# Patient Record
Sex: Male | Born: 1980
Health system: Southern US, Community
[De-identification: ages and names within clinical notes are randomized; demographics above are authoritative.]

## PROBLEM LIST (undated history)

## (undated) DIAGNOSIS — I10 Essential (primary) hypertension: Secondary | ICD-10-CM

## (undated) DIAGNOSIS — E119 Type 2 diabetes mellitus without complications: Secondary | ICD-10-CM

## (undated) HISTORY — PX: WRIST SURGERY: SHX841

---

## 2006-05-29 ENCOUNTER — Emergency Department (HOSPITAL_COMMUNITY): Admission: EM | Admit: 2006-05-29 | Discharge: 2006-05-29 | Payer: Self-pay | Admitting: Family Medicine

## 2007-01-09 ENCOUNTER — Emergency Department (HOSPITAL_COMMUNITY): Admission: EM | Admit: 2007-01-09 | Discharge: 2007-01-09 | Payer: Self-pay | Admitting: Emergency Medicine

## 2007-01-11 ENCOUNTER — Emergency Department (HOSPITAL_COMMUNITY): Admission: EM | Admit: 2007-01-11 | Discharge: 2007-01-11 | Payer: Self-pay | Admitting: Family Medicine

## 2007-01-15 ENCOUNTER — Emergency Department (HOSPITAL_COMMUNITY): Admission: EM | Admit: 2007-01-15 | Discharge: 2007-01-15 | Payer: Self-pay | Admitting: Family Medicine

## 2007-01-18 ENCOUNTER — Emergency Department (HOSPITAL_COMMUNITY): Admission: EM | Admit: 2007-01-18 | Discharge: 2007-01-18 | Payer: Self-pay | Admitting: Family Medicine

## 2007-01-23 ENCOUNTER — Emergency Department (HOSPITAL_COMMUNITY): Admission: EM | Admit: 2007-01-23 | Discharge: 2007-01-23 | Payer: Self-pay | Admitting: Family Medicine

## 2007-03-10 ENCOUNTER — Inpatient Hospital Stay (HOSPITAL_COMMUNITY): Admission: EM | Admit: 2007-03-10 | Discharge: 2007-03-12 | Payer: Self-pay | Admitting: Emergency Medicine

## 2008-03-13 ENCOUNTER — Emergency Department (HOSPITAL_COMMUNITY): Admission: EM | Admit: 2008-03-13 | Discharge: 2008-03-13 | Payer: Self-pay | Admitting: Family Medicine
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
## 2011-03-07 ENCOUNTER — Inpatient Hospital Stay (INDEPENDENT_AMBULATORY_CARE_PROVIDER_SITE_OTHER)
Admission: RE | Admit: 2011-03-07 | Discharge: 2011-03-07 | Disposition: A | Discharge status: Home / Self Care | Payer: Self-pay | Source: Ambulatory Visit | Attending: Family Medicine | Admitting: Family Medicine

## 2011-03-07 DIAGNOSIS — J020 Streptococcal pharyngitis: Secondary | ICD-10-CM

## 2011-05-16 LAB — HEPATITIS C ANTIBODY: HCV Ab: NEGATIVE

## 2011-10-29 ENCOUNTER — Emergency Department (HOSPITAL_COMMUNITY)
Admission: EM | Admit: 2011-10-29 | Discharge: 2011-10-29 | Disposition: A | Discharge status: Home / Self Care | Payer: Self-pay | Source: Home / Self Care | Attending: Family Medicine | Admitting: Family Medicine

## 2011-10-29 ENCOUNTER — Encounter (HOSPITAL_COMMUNITY): Payer: Self-pay

## 2011-10-29 DIAGNOSIS — R0789 Other chest pain: Secondary | ICD-10-CM

## 2011-10-29 MED ORDER — IBUPROFEN 800 MG PO TABS: 800.0000 mg | ORAL_TABLET | Freq: Three times a day (TID) | ORAL | Status: AC

## 2011-10-29 NOTE — ED Provider Notes (Addendum)
History     CSN: [id number]  Arrival date & time 10/29/11  1225   First MD Initiated Contact with Patient 10/29/11 1234      Chief Complaint  Patient presents with  . Chest Pain    (Consider location/radiation/quality/duration/timing/severity/associated sxs/prior treatment) Patient is a 31 y.o. male presenting with chest pain. The history is provided by the patient.  Chest Pain The chest pain began 1 - 2 hours ago. Chest pain occurs intermittently. The chest pain is improving. Associated with: turned in lobby and felt sharp pain in left lat chest. The severity of the pain is mild. The quality of the pain is described as sharp. The pain does not radiate. Chest pain is worsened by certain positions. Pertinent negatives for primary symptoms include no palpitations.     History reviewed. No pertinent past medical history.  History reviewed. No pertinent past surgical history.  History reviewed. No pertinent family history.  History  Substance Use Topics  . Smoking status: Never Smoker   . Smokeless tobacco: Not on file  . Alcohol Use: No      Review of Systems  Constitutional: Negative.   Respiratory: Negative.   Cardiovascular: Positive for chest pain. Negative for palpitations.  Gastrointestinal: Negative.     Allergies  Review of patient's allergies indicates no known allergies.  Home Medications   No current outpatient prescriptions on file.  BP 157/83  Pulse 72  Temp(Src) 98.2 F (36.8 C) (Oral)  Resp 18  SpO2 100%  Physical Exam  Nursing note and vitals reviewed. Constitutional: He is oriented to person, place, and time. He appears well-developed and well-nourished.  HENT:  Head: Normocephalic.  Right Ear: External ear normal.  Left Ear: External ear normal.  Mouth/Throat: Oropharynx is clear and moist.  Eyes: Conjunctivae are normal. Pupils are equal, round, and reactive to light.  Neck: Normal range of motion. Neck supple.  Cardiovascular: Normal  rate, regular rhythm, normal heart sounds and intact distal pulses.   Pulmonary/Chest: Breath sounds normal. He exhibits tenderness.  Abdominal: Bowel sounds are normal.  Lymphadenopathy:    He has no cervical adenopathy.  Neurological: He is alert and oriented to person, place, and time.  Skin: Skin is warm and dry.    ED Course  Procedures (including critical care time)  Labs Reviewed - No data to display No results found.   1. Musculoskeletal chest pain       MDM          Linna Hoff, MD 10/29/11 1403  Linna Hoff, MD 11/26/11 [phone]

## 2011-10-29 NOTE — ED Notes (Signed)
Pt states he has chest wall pain that started two hours ago, pain when turning body or moving.  Denies sob

## 2011-10-29 NOTE — Discharge Instructions (Signed)
Use ibuprofen as needed, activity as tolerated,

## 2012-03-06 ENCOUNTER — Encounter (HOSPITAL_BASED_OUTPATIENT_CLINIC_OR_DEPARTMENT_OTHER): Payer: Self-pay | Admitting: *Deleted

## 2012-03-06 ENCOUNTER — Emergency Department (HOSPITAL_BASED_OUTPATIENT_CLINIC_OR_DEPARTMENT_OTHER): Payer: BC Managed Care – PPO

## 2012-03-06 ENCOUNTER — Emergency Department (HOSPITAL_BASED_OUTPATIENT_CLINIC_OR_DEPARTMENT_OTHER)
Admission: EM | Admit: 2012-03-06 | Discharge: 2012-03-07 | Disposition: A | Discharge status: Home / Self Care | Payer: BC Managed Care – PPO | Attending: Emergency Medicine | Admitting: Emergency Medicine

## 2012-03-06 DIAGNOSIS — R739 Hyperglycemia, unspecified: Secondary | ICD-10-CM

## 2012-03-06 DIAGNOSIS — E119 Type 2 diabetes mellitus without complications: Secondary | ICD-10-CM

## 2012-03-06 LAB — COMPREHENSIVE METABOLIC PANEL
ALT: 38 U/L (ref 0–53)
AST: 21 U/L (ref 0–37)
Albumin: 4.1 g/dL (ref 3.5–5.2)
Alkaline Phosphatase: 114 U/L (ref 39–117)
BUN: 17 mg/dL (ref 6–23)
Chloride: 85 mEq/L — ABNORMAL LOW (ref 96–112)
Potassium: 4.2 mEq/L (ref 3.5–5.1)
Sodium: 126 mEq/L — ABNORMAL LOW (ref 135–145)
Total Bilirubin: 0.5 mg/dL (ref 0.3–1.2)
Total Protein: 8 g/dL (ref 6.0–8.3)

## 2012-03-06 LAB — URINALYSIS, ROUTINE W REFLEX MICROSCOPIC
Bilirubin Urine: NEGATIVE
Glucose, UA: >1000 mg/dL — AB
Hgb urine dipstick: NEGATIVE
Ketones, ur: NEGATIVE mg/dL
Leukocytes, UA: NEGATIVE
Protein, ur: NEGATIVE mg/dL
pH: 5.5 (ref 5.0–8.0)

## 2012-03-06 LAB — CBC WITH DIFFERENTIAL/PLATELET
Basophils Absolute: 0 10*3/uL (ref 0.0–0.1)
Basophils Relative: 1 % (ref 0–1)
Eosinophils Absolute: 0.1 10*3/uL (ref 0.0–0.7)
Hemoglobin: 16.1 g/dL (ref 13.0–17.0)
MCH: 28.9 pg (ref 26.0–34.0)
MCHC: 36.8 g/dL — ABNORMAL HIGH (ref 30.0–36.0)
Monocytes Relative: 10 % (ref 3–12)
Neutro Abs: 3.1 10*3/uL (ref 1.7–7.7)
Neutrophils Relative %: 59 % (ref 43–77)
Platelets: 189 10*3/uL (ref 150–400)
RBC: 5.57 MIL/uL (ref 4.22–5.81)

## 2012-03-06 LAB — POCT I-STAT 3, VENOUS BLOOD GAS (G3P V)
O2 Saturation: 67 %
TCO2: 31 mmol/L (ref 0–100)
pCO2, Ven: 54.4 mmHg — ABNORMAL HIGH (ref 45.0–50.0)
pH, Ven: 7.341 — ABNORMAL HIGH (ref 7.250–7.300)
pO2, Ven: 38 mmHg (ref 30.0–45.0)

## 2012-03-06 LAB — GLUCOSE, CAPILLARY
Glucose-Capillary: >600 mg/dL (ref 70–99)
Glucose-Capillary: >600 mg/dL (ref 70–99)
Glucose-Capillary: 528 mg/dL — ABNORMAL HIGH (ref 70–99)
Glucose-Capillary: 380 mg/dL — ABNORMAL HIGH (ref 70–99)

## 2012-03-06 LAB — URINE MICROSCOPIC-ADD ON

## 2012-03-06 MED ORDER — SODIUM CHLORIDE 0.9 % IV BOLUS (SEPSIS)
1000.0000 mL | Freq: Once | INTRAVENOUS | Status: AC
  Administered 2012-03-06: 1000 mL via INTRAVENOUS

## 2012-03-06 MED ORDER — INSULIN REGULAR HUMAN 100 UNIT/ML IJ SOLN
INTRAMUSCULAR | Status: AC
  Administered 2012-03-06: 5 [IU] via SUBCUTANEOUS
  Filled 2012-03-06: qty 1

## 2012-03-06 MED ORDER — INSULIN REGULAR HUMAN 100 UNIT/ML IJ SOLN
INTRAMUSCULAR | Status: AC
  Filled 2012-03-06: qty 1

## 2012-03-06 MED ORDER — INSULIN REGULAR HUMAN 100 UNIT/ML IJ SOLN
10.0000 [IU] | Freq: Once | INTRAMUSCULAR | Status: AC
  Administered 2012-03-06: 10 [IU] via SUBCUTANEOUS

## 2012-03-06 MED ORDER — INSULIN REGULAR HUMAN 100 UNIT/ML IJ SOLN
5.0000 [IU] | Freq: Once | INTRAMUSCULAR | Status: AC
  Administered 2012-03-06: 5 [IU] via SUBCUTANEOUS

## 2012-03-06 MED ORDER — INSULIN REGULAR HUMAN 100 UNIT/ML IJ SOLN: 10.0000 [IU] | Freq: Once | INTRAMUSCULAR | Status: DC

## 2012-03-06 MED ORDER — INSULIN REGULAR HUMAN 100 UNIT/ML IJ SOLN: 5.0000 [IU] | Freq: Once | INTRAMUSCULAR | Status: DC

## 2012-03-06 MED ORDER — ONDANSETRON HCL 4 MG/2ML IJ SOLN
4.0000 mg | Freq: Once | INTRAMUSCULAR | Status: AC
  Administered 2012-03-06: 4 mg via INTRAVENOUS
  Filled 2012-03-06: qty 2

## 2012-03-06 NOTE — ED Notes (Signed)
Message left with Social worker on call Lennox Laity,  Needs for lancets, glucometer and strips for newly dx diabetic. Pts info given to Child psychotherapist

## 2012-03-06 NOTE — ED Provider Notes (Signed)
History     CSN: [id number]  Arrival date & time 03/06/12  Tim Tate   First MD Initiated Contact with Patient 03/06/12 1933      Chief Complaint  Patient presents with  . Sore Throat  . Emesis  . Hyperglycemia    (Consider location/radiation/quality/duration/timing/severity/associated sxs/prior treatment) HPI Patient is a 31 year old male with no prior history of medical problems who presents today complaining of nausea, vomiting, diarrhea, increased thirst, increased urination over the past week. Patient is tachycardic to 110 upon arrival. Patient denies any abdominal pain. He endorses some sore throat which she attributes to vomiting. Patient denies any known sick contacts. He's not had any blood in his stools. Patient also reports some body aches as well. Patient denies any cough, chest pain, shortness of breath. There no other associated or modifying factors. History reviewed. No pertinent past medical history.  History reviewed. No pertinent past surgical history.  History reviewed. No pertinent family history.  History  Substance Use Topics  . Smoking status: Never Smoker   . Smokeless tobacco: Not on file  . Alcohol Use: No      Review of Systems  Constitutional: Negative.   HENT: Negative.   Eyes: Negative.   Respiratory: Negative.   Cardiovascular: Negative.   Gastrointestinal: Positive for nausea, vomiting and diarrhea.  Genitourinary: Positive for frequency.  Musculoskeletal: Negative.   Skin: Negative.   Neurological: Negative.   Hematological: Negative.   Psychiatric/Behavioral: Negative.   All other systems reviewed and are negative.    Allergies  Review of patient's allergies indicates no known allergies.  Home Medications  No current outpatient prescriptions on file.  BP 156/85  Pulse 80  Temp 98.3 F (36.8 C) (Oral)  Resp 18  Ht 5\' 10"  (1.778 m)  Wt 240 lb (108.863 kg)  BMI 34.44 kg/m2  SpO2 97%  Physical Exam  Nursing note and vitals  reviewed. GEN: Well-developed, well-nourished male in no distress HEENT: Atraumatic, normocephalic. Oropharynx clear without erythema EYES: PERRLA BL, no scleral icterus. NECK: Trachea midline, no meningismus CV: Tachycardic with regular rhythm. No murmurs, rubs, or gallops PULM: No respiratory distress.  No crackles, wheezes, or rales. GI: soft, non-tender. No guarding, rebound, or tenderness. + bowel sounds  GU: deferred Neuro: cranial nerves grossly 2-12 intact, no abnormalities of strength or sensation, A and O x 3 MSK: Patient moves all 4 extremities symmetrically, no deformity, edema, or injury noted Skin: No rashes petechiae, purpura, or jaundice Psych: no abnormality of mood   ED Course  Procedures (including critical care time)  Labs Reviewed  GLUCOSE, CAPILLARY - Abnormal; Notable for the following:    Glucose-Capillary >600 (*)     All other components within normal limits  CBC WITH DIFFERENTIAL - Abnormal; Notable for the following:    MCHC 36.8 (*)     All other components within normal limits  COMPREHENSIVE METABOLIC PANEL - Abnormal; Notable for the following:    Sodium 126 (*)     Chloride 85 (*)     Glucose, Bld 741 (*)     GFR calc non Af Amer 71 (*)     GFR calc Af Amer 83 (*)     All other components within normal limits  URINALYSIS, ROUTINE W REFLEX MICROSCOPIC - Abnormal; Notable for the following:    Specific Gravity, Urine 1.039 (*)     Glucose, UA >1000 (*)     All other components within normal limits  POCT I-STAT 3, BLOOD GAS (G3P V) -  Abnormal; Notable for the following:    pH, Ven 7.341 (*)     pCO2, Ven 54.4 (*)     Bicarbonate 29.4 (*)     All other components within normal limits  GLUCOSE, CAPILLARY - Abnormal; Notable for the following:    Glucose-Capillary >600 (*)     All other components within normal limits  GLUCOSE, CAPILLARY - Abnormal; Notable for the following:    Glucose-Capillary 528 (*)     All other components within normal  limits  GLUCOSE, CAPILLARY - Abnormal; Notable for the following:    Glucose-Capillary 380 (*)     All other components within normal limits  LIPASE, BLOOD  KETONES, QUALITATIVE  URINE MICROSCOPIC-ADD ON  BLOOD GAS, VENOUS   Dg Chest 2 View  03/06/2012  *RADIOLOGY REPORT*  Clinical Data: Nausea, vomiting, sore throat  CHEST - 2 VIEW  Comparison:  None.  Findings:  The heart size and mediastinal contours are within normal limits.  Both lungs are clear.  The visualized skeletal structures are unremarkable.  IMPRESSION: No active cardiopulmonary disease.  Original Report Authenticated By: Judie Petit. Ruel Favors, M.D.     1. Hyperglycemia without ketosis   2. Diabetes mellitus, new onset       MDM  Patient was evaluated by myself. The patient reported no history of diabetes but had glucose greater than 600. Based on presentation with polyuria and polydipsia as well as nausea, vomiting, diarrhea I was concerned about new onset diabetes. Urinalysis showed greater than 1000 glucose but patient had no ketones noted. Patient had VBG which showed no significant acidosis. Serum ketones were also checked and were negative. Patient had a hyponatremia which was a pseudohyponatremia secondary to hyperglycemia. He had no abnormalities of liver function and lipase. Patient's urinalysis showed no signs of infection. Chest x-ray was performed and was negative. Patient was treated with IV fluids and insulin. Patient does not currently have a primary care provider but preferred discharge home. Glucose was improving. Patient was feeling better. He had no vomiting or diarrhea here. I discussed the patient with hospitalist, Dr. Toniann Fail. Given the patient's degree of hyperglycemia on presentation he recommended starting the patient on Amaryl 1 mg in the morning and metformin 500 mg at night. Patient was referred to Child psychotherapist at [facility] for resources including glucometer, lancets, and testing trips. Patient also was  given prescriptions. He was given contact number for her nurse manager here to assist him with this if he has any difficulties tomorrow.        Cyndra Numbers, MD 03/07/12 [phone]

## 2012-03-06 NOTE — ED Notes (Signed)
Pt c/o n/v sore throat and bodyaches x 1 week

## 2012-03-06 NOTE — ED Notes (Signed)
Dr. Hunt at bedside.

## 2012-03-06 NOTE — ED Notes (Signed)
Pt reports nausea and vomiting x 1 week with increased thirst. Denies abdominal pains. States he thinks his throat hurts from all of his vomiting. Denies any hx of diabetes.

## 2012-03-06 NOTE — ED Notes (Signed)
Pt was complaining of nausea. States he thinks it may be from the 10 large cups of water he has consumed since arrival. Pt given saltine crackers and encouraged to eat. zofran administered. Will reassess shortly.

## 2012-03-07 LAB — GLUCOSE, CAPILLARY: Glucose-Capillary: 365 mg/dL — ABNORMAL HIGH (ref 70–99)

## 2012-03-07 MED ORDER — METFORMIN HCL 500 MG PO TABS: 500.0000 mg | ORAL_TABLET | Freq: Every day | ORAL | Status: DC

## 2012-03-07 MED ORDER — GLUCOSE BLOOD VI STRP: ORAL_STRIP | Status: AC

## 2012-03-07 MED ORDER — GLIMEPIRIDE 1 MG PO TABS: 1.0000 mg | ORAL_TABLET | Freq: Every day | ORAL | Status: DC

## 2012-03-07 MED ORDER — FREESTYLE LANCETS MISC: Status: AC

## 2012-03-07 NOTE — ED Notes (Signed)
Spoke to patient in depth regarding diabetes and f/u care, diet, exercise and glucose testing. Pt was given the [facility] Case Manager's phone number to f/u with for resource assistance for glucometer, lancets and test strips. Pt was given the number of on-call case manager Lennox Laity [phone]. Pt was told to call her first for assistance, and if unable to reach her, then to call back to [facility] to speak with day shift charge RN, Larene Beach, and ask for assistance. Pt verbalized understanding. States his mother is a Engineer, civil (consulting) and will be able to recommend a PMD to f/u with, and his girlfriend has had experience in the past checking glucoses with the glucometer machine. Pt understands changes he needs to make regarding his diet, lifestyle and exercise and he expressed interest and desire in changing his poor lifestyle habits.

## 2012-03-07 NOTE — ED Notes (Signed)
Randa Evens, pharm tech called to find out options for assistance with testing supplies and the only option available is for pt to call "[facility]" [facility] at [phone]. Pt was called by this nurse and given information. Pt sts he was able to get meds filled and is feeling "much better".

## 2012-03-07 NOTE — Progress Notes (Signed)
Received consult from [facility] [place] with regard to new diabetes diagnosis for patient. I contacted patient and educated him about [facility] for free meds, the [employer] glucometer program at [place], and reviewed the list of self-pay facilities in the area. Patient was very appreciative for the information and services we provided.

## 2013-04-08 ENCOUNTER — Encounter (HOSPITAL_BASED_OUTPATIENT_CLINIC_OR_DEPARTMENT_OTHER): Payer: Self-pay

## 2013-04-08 ENCOUNTER — Emergency Department (HOSPITAL_BASED_OUTPATIENT_CLINIC_OR_DEPARTMENT_OTHER)
Admission: EM | Admit: 2013-04-08 | Discharge: 2013-04-08 | Disposition: A | Discharge status: Home / Self Care | Payer: BC Managed Care – PPO | Attending: Emergency Medicine | Admitting: Emergency Medicine

## 2013-04-08 DIAGNOSIS — H109 Unspecified conjunctivitis: Secondary | ICD-10-CM | POA: Insufficient documentation

## 2013-04-08 DIAGNOSIS — E119 Type 2 diabetes mellitus without complications: Secondary | ICD-10-CM | POA: Insufficient documentation

## 2013-04-08 HISTORY — DX: Type 2 diabetes mellitus without complications: E11.9

## 2013-04-08 MED ORDER — ERYTHROMYCIN 5 MG/GM OP OINT
1.0000 "application " | TOPICAL_OINTMENT | Freq: Four times a day (QID) | OPHTHALMIC | Status: DC
  Administered 2013-04-08: 1 via OPHTHALMIC
  Filled 2013-04-08: qty 3.5

## 2013-04-08 NOTE — ED Provider Notes (Signed)
CSN: [id number]     Arrival date & time 04/08/13  1804 History  This chart was scribed for Tim Munch, MD by Arlan Organ, ED Scribe. This patient was seen in room MH07/MH07 and the patient's care was started at 6:38 PM.   Chief Complaint  Patient presents with  . Eye Problem   (Consider location/radiation/quality/duration/timing/severity/associated sxs/prior Treatment) The history is provided by the patient. No language interpreter was used.   HPI Comments: Tim Tate is a 32 y.o. male diabetic who presents to the Emergency Department complaining of  bilateral eye redness and irritation with associated clear discharge that first appeared 2 days ago. Pt states when it first occurred, his eye was painful to the touch, but has some what improved over time.  Pt has tried Visine with no relief. Pt denies sore throat, nausea, emesis, fever, and HA. Pt states he just got back from a cruise to the [place] today. Pt states his diabetes is currently well managed with prescribed pills, his sugars are normally in the 90's.   Past Medical History  Diagnosis Date  . Diabetes mellitus without complication    Past Surgical History  Procedure Laterality Date  . Wrist surgery     No family history on file. History  Substance Use Topics  . Smoking status: Never Smoker   . Smokeless tobacco: Not on file  . Alcohol Use: No    Review of Systems  Constitutional:       Per HPI, otherwise negative  HENT:       Per HPI, otherwise negative  Respiratory:       Per HPI, otherwise negative  Cardiovascular:       Per HPI, otherwise negative  Gastrointestinal: Negative for vomiting.  Endocrine:       Negative aside from HPI  Genitourinary:       Neg aside from HPI   Musculoskeletal:       Per HPI, otherwise negative  Skin: Negative.   Neurological: Negative for syncope.    Allergies  Review of patient's allergies indicates no known allergies.  Home Medications   Current Outpatient  Rx  Name  Route  Sig  Dispense  Refill  . EXPIRED: glimepiride (AMARYL) 1 MG tablet   Oral   Take 1 tablet (1 mg total) by mouth daily before breakfast.   30 tablet   1   . EXPIRED: metFORMIN (GLUCOPHAGE) 500 MG tablet   Oral   Take 1 tablet (500 mg total) by mouth at bedtime.   30 tablet   1    BP 147/92  Pulse 94  Temp(Src) 98.4 F (36.9 C) (Oral)  Resp 16  Ht 5\' 9"  (1.753 m)  Wt 245 lb (111.131 kg)  BMI 36.16 kg/m2  SpO2 98% Physical Exam  Nursing note and vitals reviewed. Constitutional: He is oriented to person, place, and time. He appears well-developed. No distress.  HENT:  Head: Normocephalic and atraumatic.  Eyes: EOM are normal. Pupils are equal, round, and reactive to light.  Bilateral conjunctival injection Mild edema in bilateral eyelids  Cardiovascular: Normal rate and regular rhythm.   Pulmonary/Chest: Effort normal. No stridor. No respiratory distress.  Abdominal: He exhibits no distension.  Musculoskeletal: He exhibits no edema.  Neurological: He is alert and oriented to person, place, and time.  Skin: Skin is warm and dry.  Psychiatric: He has a normal mood and affect.    ED Course  Procedures (including critical care time)  DIAGNOSTIC STUDIES: Oxygen Saturation  is 98% on RA, Normal by my interpretation.    COORDINATION OF CARE: 6:50 PM-Discussed treatment plan which includes antibiotics. Advised pt to follow up with ophthalmologist if eye problem does not improve in 3 days. Pt agreed to plan.    Labs Review Labs Reviewed - No data to display Imaging Review No results found.  MDM  No diagnosis found.  I personally performed the services described in this documentation, which was scribed in my presence. The recorded information has been reviewed and is accurate.   Patient presents with several days of bilateral injected eyes with discharge.  Patient is in no acute with no acuity loss, no fever, no other signs or symptoms suggestive of  systemic infection.  Absent distress, absent pain and there is also little suspicion for corneal abrasion.  Patient started on erythromycin topical medication for conjunctivitis, discharged to f/u w optho  Tim Munch, MD 04/08/13 [phone]

## 2013-04-08 NOTE — ED Notes (Signed)
bilat eye redness x 3 days

## 2013-04-08 NOTE — Discharge Instructions (Signed)
As discussed, it is important to follow up with our ophthalmologist if her condition does not improve in the next 3 or 4 days.  Please return here for concerning changes in your condition   Conjunctivitis Conjunctivitis is commonly called "pink eye." Conjunctivitis can be caused by bacterial or viral infection, allergies, or injuries. There is usually redness of the lining of the eye, itching, discomfort, and sometimes discharge. There may be deposits of matter along the eyelids. A viral infection usually causes a watery discharge, while a bacterial infection causes a yellowish, thick discharge. Pink eye is very contagious and spreads by direct contact. You may be given antibiotic eyedrops as part of your treatment. Before using your eye medicine, remove all drainage from the eye by washing gently with warm water and cotton balls. Continue to use the medication until you have awakened 2 mornings in a row without discharge from the eye. Do not rub your eye. This increases the irritation and helps spread infection. Use separate towels from other household members. Wash your hands with soap and water before and after touching your eyes. Use cold compresses to reduce pain and sunglasses to relieve irritation from light. Do not wear contact lenses or wear eye makeup until the infection is gone. SEEK MEDICAL CARE IF:   Your symptoms are not better after 3 days of treatment.  You have increased pain or trouble seeing.  The outer eyelids become very red or swollen. Document Released: 08/25/2004 Document Revised: 10/10/2011 Document Reviewed: 07/18/2005 [employer] Patient Information 2014 [place], [place].

## 2013-04-08 NOTE — ED Notes (Signed)
MD at bedside. 

## 2013-04-10 NOTE — ED Notes (Signed)
Pt called requesting a note for school and work that states he was to be held out. Pt informed that since he did not receive a note while here he would need to call PCP or return for eval. Pt told he could receive a note that stated he was seen here on date of visit.

## 2014-01-05 IMAGING — CR DG CHEST 2V
2 series · 2 of 2 positions shown · non-contrast
Comparison: None.

CLINICAL DATA: Nausea, vomiting, sore throat

CHEST - 2 VIEW

[w chest pa]
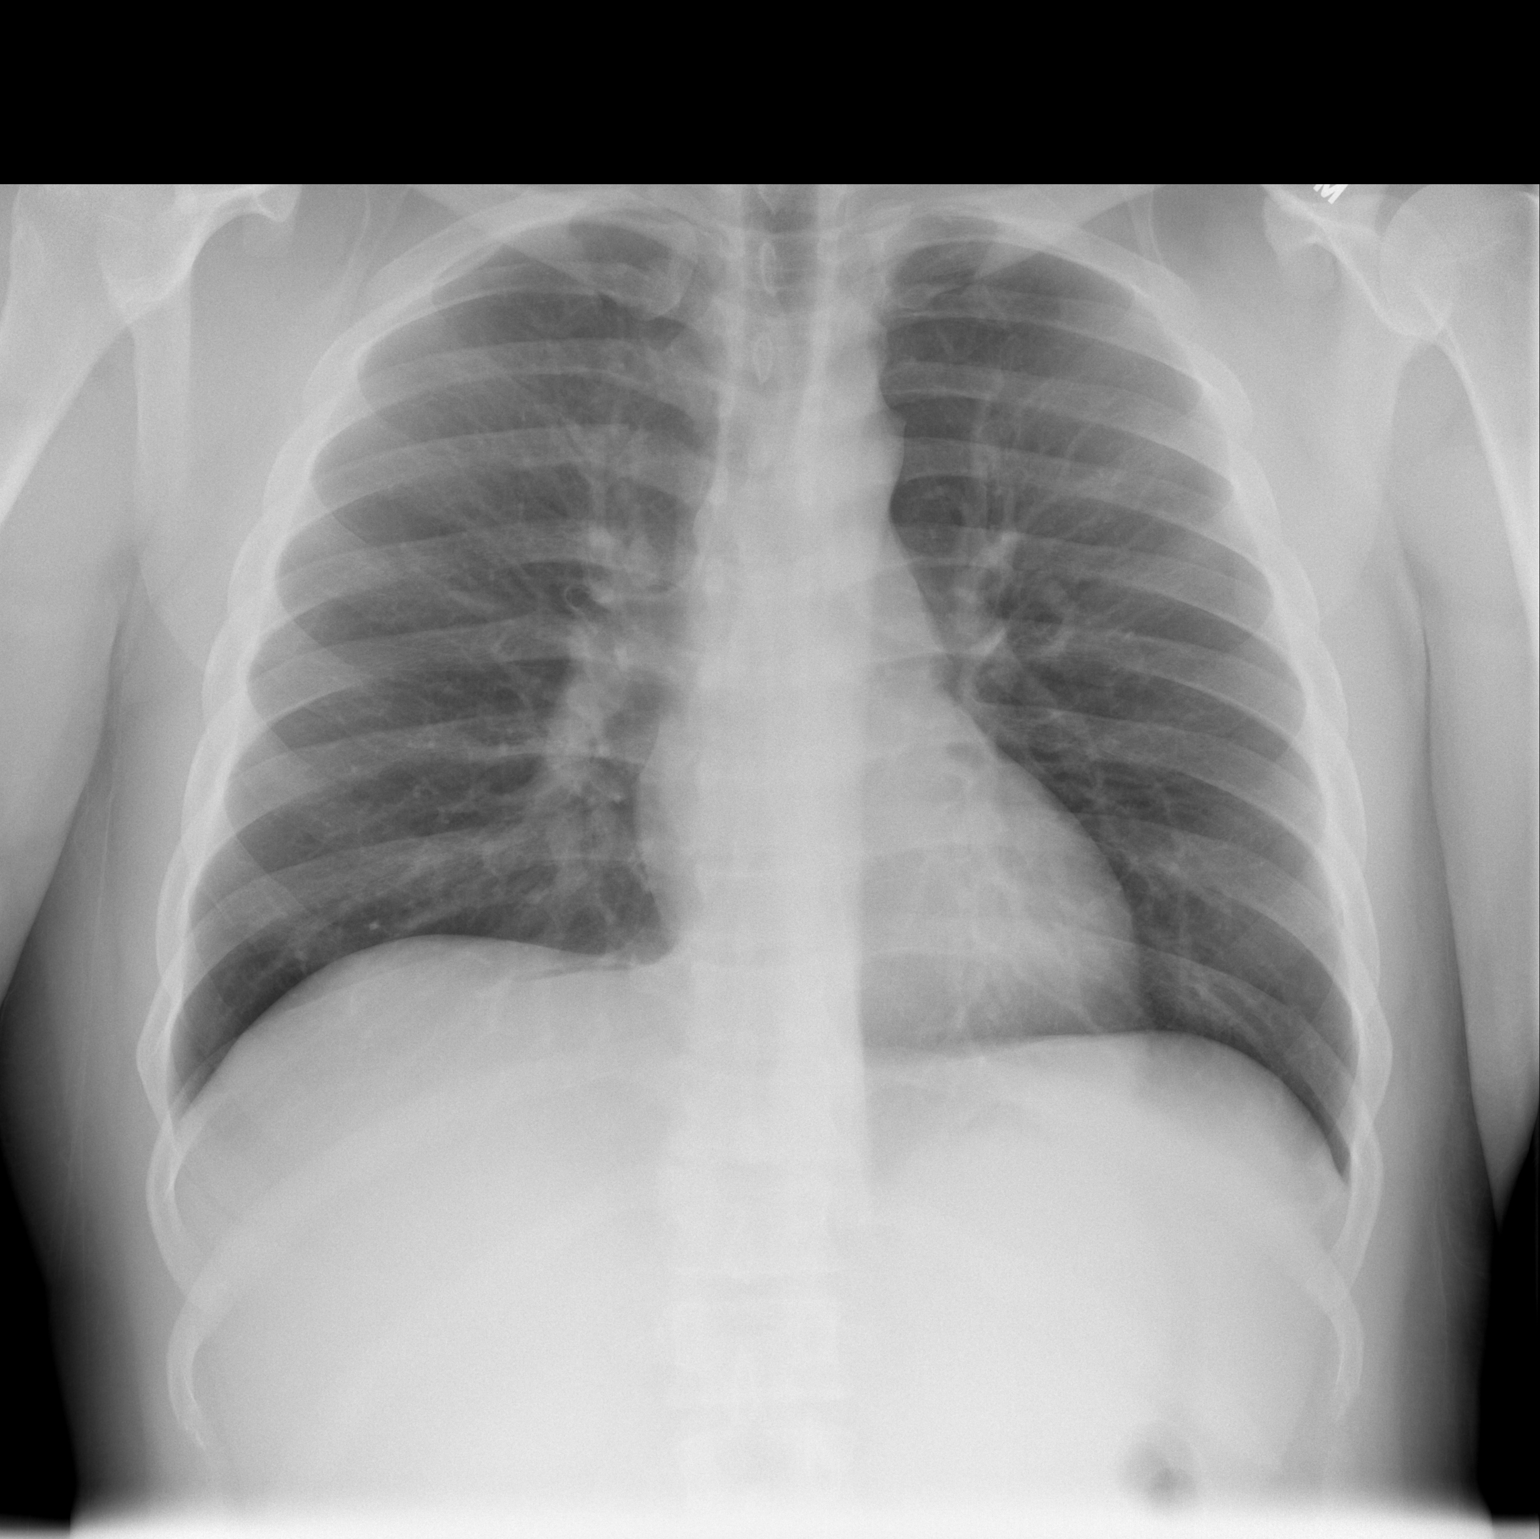

[w chest lat]
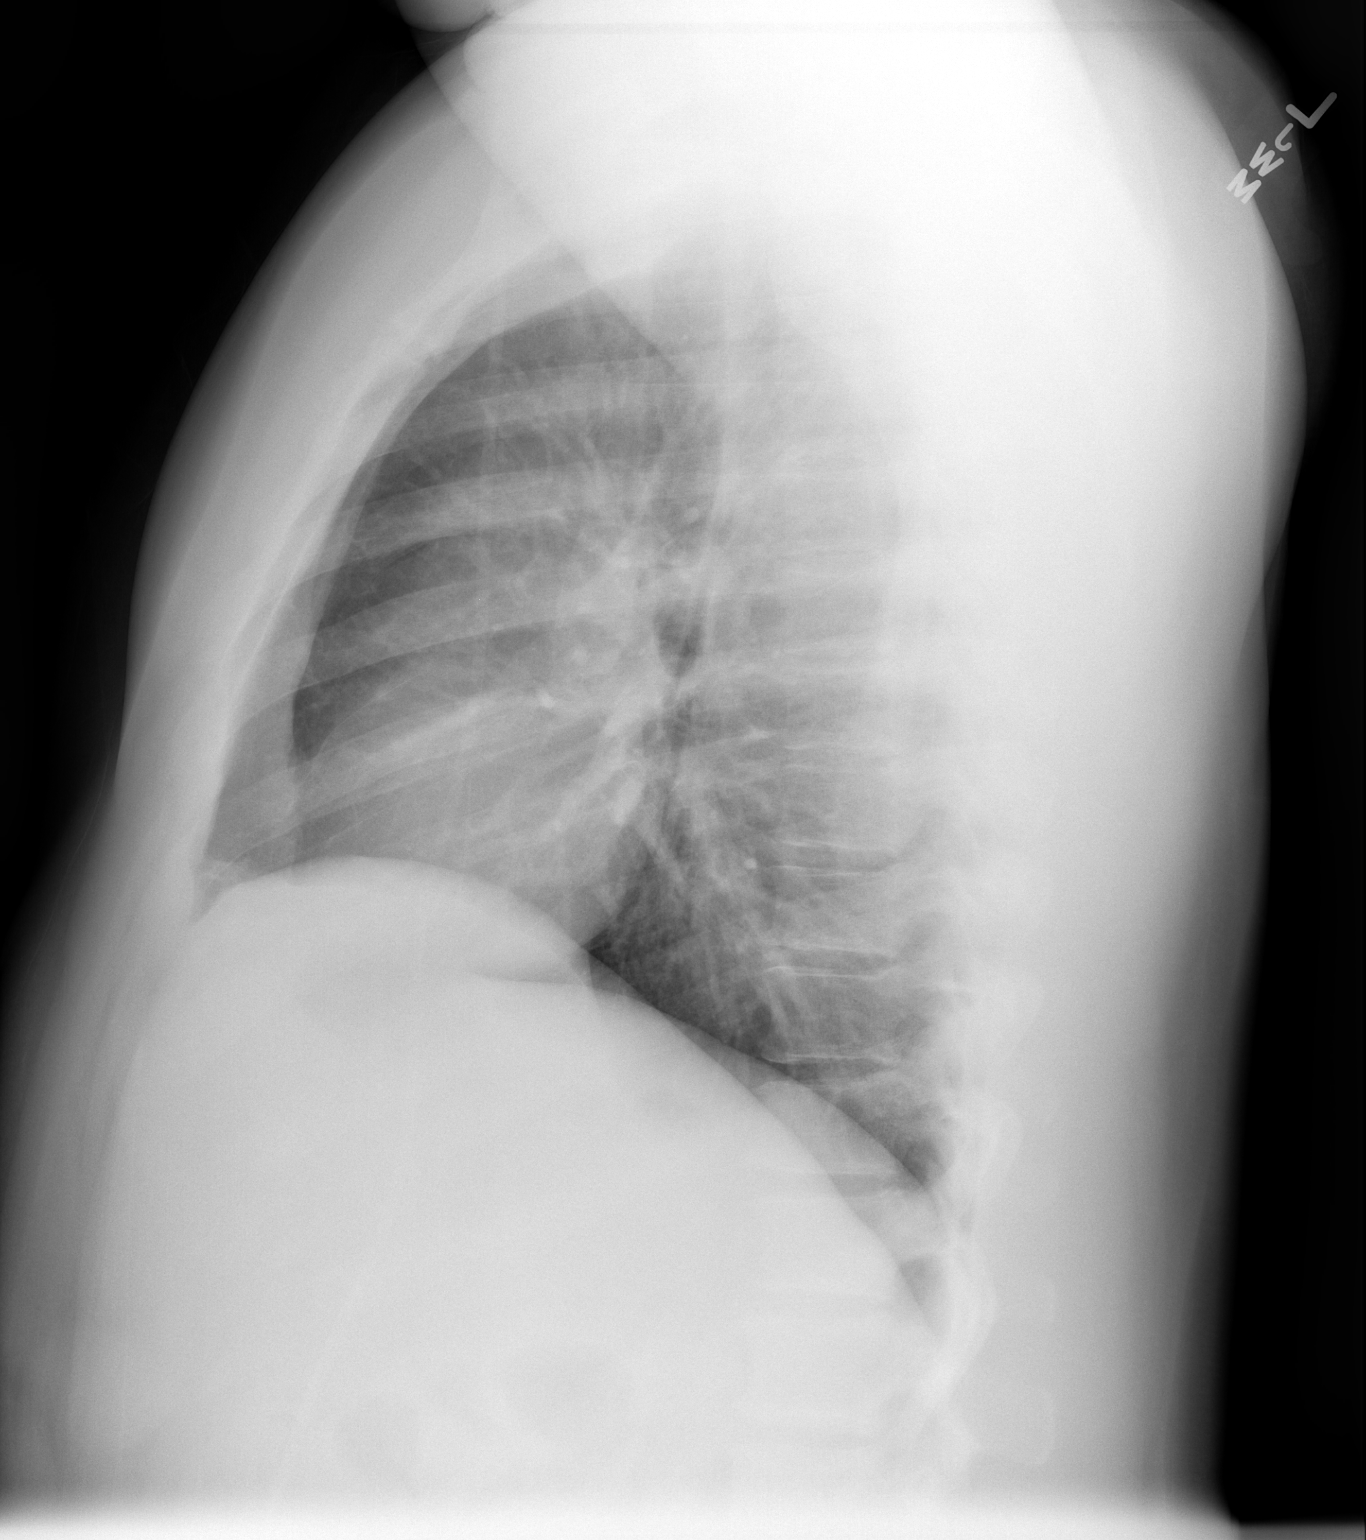

[2 of 2 positions shown; findings below may reference images not displayed]

FINDINGS: The heart size and mediastinal contours are within
normal limits.  Both lungs are clear.  The visualized skeletal
structures are unremarkable.
IMPRESSION: No active cardiopulmonary disease.

## 2016-06-29 ENCOUNTER — Encounter: Payer: Self-pay | Admitting: Podiatry

## 2016-06-29 ENCOUNTER — Ambulatory Visit (INDEPENDENT_AMBULATORY_CARE_PROVIDER_SITE_OTHER): Payer: 59 | Admitting: Podiatry

## 2016-06-29 VITALS — BP 134/87 | HR 89 | Resp 18

## 2016-06-29 DIAGNOSIS — B351 Tinea unguium: Secondary | ICD-10-CM | POA: Diagnosis not present

## 2016-06-29 DIAGNOSIS — E119 Type 2 diabetes mellitus without complications: Secondary | ICD-10-CM | POA: Diagnosis not present

## 2016-06-29 NOTE — Progress Notes (Signed)
   Subjective:    Patient ID: Tim Tate, male    DOB: 02/19/1981, 35 y.o.   MRN: [id number]  HPI    This patient presents today at the recommendation of his primary care physician for a diabetic foot examination. Patient is diabetic for approximately 4 years and denies any history of foot ulceration, claudication or amputation. Patient denies history of smoking. Patient has noticed some thickening in the toenails gradually over the last several years without any specific treatment. He also notices that his feet are dry and scaling and denies any specific treatment for these problems.  Patient is a Secretary/administrator  Review of Systems  All other systems reviewed and are negative.      Objective:   Physical Exam  Orientated 3  Vascular: No peripheral edema bilaterally No calf tenderness bilaterally DP and PT pulses 2/4 bilaterally Capillary reflex immediate bilaterally  Neurological: Sensation to 10 g monofilament wire intact 5/5 bilaterally Vibratory sensation reactive bilaterally Ankle reflex equal and reactive bilaterally  Dermatological: No open skin lesions bilaterally Mild plantar scaling bilaterally Deformed discolored toenails 1, 4, 5 bilaterally  Musculoskeletal: HAV bilaterally There is no pain or crepitus on range of motion ankle, subtalar, midtarsal joints bilaterally Manual motor testing dorsi flexion, plantar flexion, inversion, eversion 5/5 bilaterally      Assessment & Plan:   Assessment: Diabetic with satisfactory neurovascular status Diabetic without foot complications Mycotic toenails 6 Dry skin versus possible tinea pedis bilaterally  Plan: Today I reviewed the results of the exam in detail. We had a discussion about treatment of suspect fungal toenails including no treatment, debridement, topical medication oral medication. I informed patient that the most reliable treatment for treatment of fungal toenails a proximal medial percent  successful was oral medication. Patient is willing to use oral medication if lab is positive for fungal infection  Debridement of left hallux nail obtaining nail fragments and subungual debris and fourth right toenail with similar appearance and submitted to lab for PAS and fungal culture  Notify patient upon receipt of lab

## 2016-06-29 NOTE — Patient Instructions (Signed)
Today her diabetic foot screen demonstrated adequate circulation and feeling in your feet. I submitted some nail fragments to the lab for possible oral medication for treatment of fungal toenails. The lab response time is at least 4 weeks. Our office will contact you when you receive the lab results  Diabetes and Foot Care Diabetes may cause you to have problems because of poor blood supply (circulation) to your feet and legs. This may cause the skin on your feet to become thinner, break easier, and heal more slowly. Your skin may become dry, and the skin may peel and crack. You may also have nerve damage in your legs and feet causing decreased feeling in them. You may not notice minor injuries to your feet that could lead to infections or more serious problems. Taking care of your feet is one of the most important things you can do for yourself. Follow these instructions at home:  Wear shoes at all times, even in the house. Do not go barefoot. Bare feet are easily injured.  Check your feet daily for blisters, cuts, and redness. If you cannot see the bottom of your feet, use a mirror or ask someone for help.  Wash your feet with warm water (do not use hot water) and mild soap. Then pat your feet and the areas between your toes until they are completely dry. Do not soak your feet as this can dry your skin.  Apply a moisturizing lotion or petroleum jelly (that does not contain alcohol and is unscented) to the skin on your feet and to dry, brittle toenails. Do not apply lotion between your toes.  Trim your toenails straight across. Do not dig under them or around the cuticle. File the edges of your nails with an emery board or nail file.  Do not cut corns or calluses or try to remove them with medicine.  Wear clean socks or stockings every day. Make sure they are not too tight. Do not wear knee-high stockings since they may decrease blood flow to your legs.  Wear shoes that fit properly and have  enough cushioning. To break in new shoes, wear them for just a few hours a day. This prevents you from injuring your feet. Always look in your shoes before you put them on to be sure there are no objects inside.  Do not cross your legs. This may decrease the blood flow to your feet.  If you find a minor scrape, cut, or break in the skin on your feet, keep it and the skin around it clean and dry. These areas may be cleansed with mild soap and water. Do not cleanse the area with peroxide, alcohol, or iodine.  When you remove an adhesive bandage, be sure not to damage the skin around it.  If you have a wound, look at it several times a day to make sure it is healing.  Do not use heating pads or hot water bottles. They may burn your skin. If you have lost feeling in your feet or legs, you may not know it is happening until it is too late.  Make sure your health care provider performs a complete foot exam at least annually or more often if you have foot problems. Report any cuts, sores, or bruises to your health care provider immediately. Contact a health care provider if:  You have an injury that is not healing.  You have cuts or breaks in the skin.  You have an ingrown nail.  You  notice redness on your legs or feet.  You feel burning or tingling in your legs or feet.  You have pain or cramps in your legs and feet.  Your legs or feet are numb.  Your feet always feel cold. Get help right away if:  There is increasing redness, swelling, or pain in or around a wound.  There is a red line that goes up your leg.  Pus is coming from a wound.  You develop a fever or as directed by your health care provider.  You notice a bad smell coming from an ulcer or wound. This information is not intended to replace advice given to you by your health care provider. Make sure you discuss any questions you have with your health care provider. Document Released: 07/15/2000 Document Revised:  12/24/2015 Document Reviewed: 12/25/2012 Elsevier Interactive Patient Education  2017 [employer].

## 2016-08-10 ENCOUNTER — Ambulatory Visit (INDEPENDENT_AMBULATORY_CARE_PROVIDER_SITE_OTHER): Payer: 59 | Admitting: Podiatry

## 2016-08-10 ENCOUNTER — Encounter: Payer: Self-pay | Admitting: Podiatry

## 2016-08-10 DIAGNOSIS — Z79899 Other long term (current) drug therapy: Secondary | ICD-10-CM

## 2016-08-10 DIAGNOSIS — B351 Tinea unguium: Secondary | ICD-10-CM | POA: Diagnosis not present

## 2016-08-10 MED ORDER — TERBINAFINE HCL 250 MG PO TABS: 250.0000 mg | ORAL_TABLET | Freq: Every day | ORAL | 2 refills | Status: DC

## 2016-08-10 NOTE — Progress Notes (Signed)
Patient ID: Tim Tate, male   DOB: 1980/11/13, 36 y.o.   MRN: [id number]   Subjective: This patient presents for follow-up care and discussion for positive fungal culture collected 06/29/2016.  Objective: Orientated 3  Vascular: No peripheral edema bilaterally No calf tenderness bilaterally DP and PT pulses 2/4 bilaterally Capillary reflex immediate bilaterally  Neurological: Sensation to 10 g monofilament wire intact 5/5 bilaterally Vibratory sensation reactive bilaterally Ankle reflex equal and reactive bilaterally  Dermatological: No open skin lesions bilaterally Mild plantar scaling bilaterally Deformed discolored toenails 1, 4, 5 bilaterally  Musculoskeletal: HAV bilaterally There is no pain or crepitus on range of motion ankle, subtalar, midtarsal joints bilaterally Manual motor testing dorsi flexion, plantar flexion, inversion, eversion 5/5 bilaterally  Results of  [facility] services dated 07/18/2016 Culture positive for Trichophyton rubrum PAS stain demonstrates moderate fungal growth  Assessment: Diabetic with satisfactory neurovascular status Diabetic without foot complications Mycotic toenails 6 Dry skin versus possible tinea pedis bilaterally  Plan: Today I reviewed the results of the lab with patient today and discussed treatment options including no treatment, topical treatment oral medication. I recommended oral medication. Patient verbally consents  Issued lab request for baseline hepatic function within normal limits patient will begin Notify patient results of baseline hepatic function Rx Terbinafine 250 mg #30 one daily 2 refills  Reappoint 5 weeks

## 2016-08-10 NOTE — Patient Instructions (Signed)
Your lab results confirmed a, and fungal infection and is sensitive to oral medication After you had your lab test will contact you and if his within normal limits she will begin taking the medication Terbinafine 250 mg by mouth once daily 90 days Return for evaluation of medicine tolerance within 30 days   Diabetes and Foot Care Diabetes may cause you to have problems because of poor blood supply (circulation) to your feet and legs. This may cause the skin on your feet to become thinner, break easier, and heal more slowly. Your skin may become dry, and the skin may peel and crack. You may also have nerve damage in your legs and feet causing decreased feeling in them. You may not notice minor injuries to your feet that could lead to infections or more serious problems. Taking care of your feet is one of the most important things you can do for yourself. Follow these instructions at home:  Wear shoes at all times, even in the house. Do not go barefoot. Bare feet are easily injured.  Check your feet daily for blisters, cuts, and redness. If you cannot see the bottom of your feet, use a mirror or ask someone for help.  Wash your feet with warm water (do not use hot water) and mild soap. Then pat your feet and the areas between your toes until they are completely dry. Do not soak your feet as this can dry your skin.  Apply a moisturizing lotion or petroleum jelly (that does not contain alcohol and is unscented) to the skin on your feet and to dry, brittle toenails. Do not apply lotion between your toes.  Trim your toenails straight across. Do not dig under them or around the cuticle. File the edges of your nails with an emery board or nail file.  Do not cut corns or calluses or try to remove them with medicine.  Wear clean socks or stockings every day. Make sure they are not too tight. Do not wear knee-high stockings since they may decrease blood flow to your legs.  Wear shoes that fit properly  and have enough cushioning. To break in new shoes, wear them for just a few hours a day. This prevents you from injuring your feet. Always look in your shoes before you put them on to be sure there are no objects inside.  Do not cross your legs. This may decrease the blood flow to your feet.  If you find a minor scrape, cut, or break in the skin on your feet, keep it and the skin around it clean and dry. These areas may be cleansed with mild soap and water. Do not cleanse the area with peroxide, alcohol, or iodine.  When you remove an adhesive bandage, be sure not to damage the skin around it.  If you have a wound, look at it several times a day to make sure it is healing.  Do not use heating pads or hot water bottles. They may burn your skin. If you have lost feeling in your feet or legs, you may not know it is happening until it is too late.  Make sure your health care provider performs a complete foot exam at least annually or more often if you have foot problems. Report any cuts, sores, or bruises to your health care provider immediately. Contact a health care provider if:  You have an injury that is not healing.  You have cuts or breaks in the skin.  You have an  ingrown nail.  You notice redness on your legs or feet.  You feel burning or tingling in your legs or feet.  You have pain or cramps in your legs and feet.  Your legs or feet are numb.  Your feet always feel cold. Get help right away if:  There is increasing redness, swelling, or pain in or around a wound.  There is a red line that goes up your leg.  Pus is coming from a wound.  You develop a fever or as directed by your health care provider.  You notice a bad smell coming from an ulcer or wound. This information is not intended to replace advice given to you by your health care provider. Make sure you discuss any questions you have with your health care provider. Document Released: 07/15/2000 Document Revised:  12/24/2015 Document Reviewed: 12/25/2012 Elsevier Interactive Patient Education  2017 [employer].

## 2016-08-11 LAB — HEPATIC FUNCTION PANEL
ALBUMIN: 4.4 g/dL (ref 3.6–5.1)
ALT: 23 U/L (ref 9–46)
AST: 18 U/L (ref 10–40)
Alkaline Phosphatase: 73 U/L (ref 40–115)
BILIRUBIN TOTAL: 0.4 mg/dL (ref 0.2–1.2)
Bilirubin, Direct: 0.1 mg/dL (ref <=0.2)
Indirect Bilirubin: 0.3 mg/dL (ref 0.2–1.2)
Total Protein: 7.9 g/dL (ref 6.1–8.1)

## 2016-08-19 ENCOUNTER — Telehealth: Payer: Self-pay | Admitting: *Deleted

## 2016-08-19 NOTE — Telephone Encounter (Addendum)
-----   Message from Gean Birchwood, DPM sent at 08/16/2016  7:41 AM EST ----- Hepatic function panel dated 08/10/2016 within normal limits Contact patient and have him begin terbinafine 250 mg once daily 30 doses, 2 refills Confirm intermediate follow-up exam in approximately 4 weeks.08/19/2016-Left message that blood work was normal, to begin medication, and make an appt for 4 weeks after beginning the medication.

## 2016-09-21 ENCOUNTER — Encounter: Payer: Self-pay | Admitting: Podiatry

## 2016-09-21 ENCOUNTER — Ambulatory Visit (INDEPENDENT_AMBULATORY_CARE_PROVIDER_SITE_OTHER): Payer: 59 | Admitting: Podiatry

## 2016-09-21 VITALS — BP 109/88 | HR 80 | Resp 18

## 2016-09-21 DIAGNOSIS — Z79899 Other long term (current) drug therapy: Secondary | ICD-10-CM

## 2016-09-21 NOTE — Progress Notes (Signed)
   Subjective:    Patient ID: Tim Tate, male    DOB: 08/17/1980, 36 y.o.   MRN: [id number]  HPI     As patient presents today for follow-up evaluation and tolerance of terbinafine 250 mg. At this time patient is completed approximately 5 weeks of terbinafine 250 mg once daily. Denies any bodily complaints including headaches, taste disturbance, bodily rashes.  Review of Systems  All other systems reviewed and are negative.      Objective:   Physical Exam  Orientated 3  Vascular: No peripheral edema bilaterally No calf tenderness bilaterally DP and PT pulses 2/4 bilaterally Capillary reflex immediate bilaterally  Neurological: Sensation to 10 g monofilament wire intact 5/5 bilaterally Vibratory sensation reactive bilaterally Ankle reflex equal and reactive bilaterally  Dermatological: No open skin lesions bilaterally Mild plantar scaling bilaterally Deformed discolored toenails 1, 4, 5 bilaterally  Musculoskeletal: HAV bilaterally There is no pain or crepitus on range of motion ankle, subtalar, midtarsal joints bilaterally Manual motor testing dorsi flexion, plantar flexion, inversion, eversion 5/5 bilaterally  Results of  [facility] services dated 07/18/2016 Culture positive for Trichophyton rubrum PAS stain demonstrates moderate fungal growth      Assessment & Plan:    Assessment: Diabetic with satisfactory neurovascular status Diabetic without foot complications Mycotic toenails 6 Dry skin versus possible tinea pedis bilaterally Satisfactory tolerance of terbinafine 250 mg 5 weeks  Plan: Issued patient request for hepatic function to completed at 6 weeks of terbinafine and notify patient of results. Unless there is a contraindication patient will complete a total of 90 doses terbinafine 250 mg once daily  Reappoint at patient's request

## 2016-09-21 NOTE — Patient Instructions (Signed)
Repeat your liver function panel at approximately 6 weeks out of the 12 weeks of medication for approximately halfway through bottle #2. Our office will contact you with results and less there is a problem you complete a total of 90 doses of the terbinafine taking one daily

## 2016-09-30 LAB — HEPATIC FUNCTION PANEL
ALBUMIN: 4.1 g/dL (ref 3.6–5.1)
ALT: 19 U/L (ref 9–46)
AST: 14 U/L (ref 10–40)
Alkaline Phosphatase: 72 U/L (ref 40–115)
Bilirubin, Direct: 0.1 mg/dL (ref <=0.2)
Indirect Bilirubin: 0.3 mg/dL (ref 0.2–1.2)
Total Bilirubin: 0.4 mg/dL (ref 0.2–1.2)
Total Protein: 7.3 g/dL (ref 6.1–8.1)

## 2016-10-06 ENCOUNTER — Telehealth: Payer: Self-pay | Admitting: *Deleted

## 2016-10-06 NOTE — Telephone Encounter (Addendum)
-----   Message from Gean Birchwood, DPM sent at 10/05/2016  7:45 AM EST ----- Hepatic function panel dated 09/29/2016 within normal limits Contact patient and have him complete the remaining terbinafine 250 mg 1 daily for a total of 90 doses. 10/06/2016-Left message informing pt of Dr. Phoebe Perch orders.

## 2017-06-01 DIAGNOSIS — E1165 Type 2 diabetes mellitus with hyperglycemia: Secondary | ICD-10-CM | POA: Insufficient documentation

## 2017-06-01 HISTORY — DX: Type 2 diabetes mellitus with hyperglycemia: E11.65

## 2018-07-23 ENCOUNTER — Emergency Department (HOSPITAL_COMMUNITY)
Admission: EM | Admit: 2018-07-23 | Discharge: 2018-07-24 | Disposition: A | Discharge status: Home / Self Care | Payer: BLUE CROSS/BLUE SHIELD | Attending: Emergency Medicine | Admitting: Emergency Medicine

## 2018-07-23 ENCOUNTER — Other Ambulatory Visit: Payer: Self-pay

## 2018-07-23 ENCOUNTER — Encounter (HOSPITAL_COMMUNITY): Payer: Self-pay | Admitting: *Deleted

## 2018-07-23 DIAGNOSIS — Z7984 Long term (current) use of oral hypoglycemic drugs: Secondary | ICD-10-CM | POA: Diagnosis not present

## 2018-07-23 DIAGNOSIS — L02212 Cutaneous abscess of back [any part, except buttock]: Secondary | ICD-10-CM | POA: Diagnosis present

## 2018-07-23 DIAGNOSIS — L02219 Cutaneous abscess of trunk, unspecified: Secondary | ICD-10-CM

## 2018-07-23 DIAGNOSIS — E1165 Type 2 diabetes mellitus with hyperglycemia: Secondary | ICD-10-CM | POA: Insufficient documentation

## 2018-07-23 DIAGNOSIS — R739 Hyperglycemia, unspecified: Secondary | ICD-10-CM

## 2018-07-23 DIAGNOSIS — L03319 Cellulitis of trunk, unspecified: Secondary | ICD-10-CM | POA: Diagnosis not present

## 2018-07-23 LAB — CBC
HEMATOCRIT: 46 % (ref 39.0–52.0)
HEMOGLOBIN: 15.5 g/dL (ref 13.0–17.0)
MCH: 27.2 pg (ref 26.0–34.0)
MCHC: 33.7 g/dL (ref 30.0–36.0)
MCV: 80.8 fL (ref 80.0–100.0)
NRBC: 0 % (ref 0.0–0.2)
Platelets: 248 10*3/uL (ref 150–400)
RBC: 5.69 MIL/uL (ref 4.22–5.81)
RDW: 11.9 % (ref 11.5–15.5)
WBC: 7.6 10*3/uL (ref 4.0–10.5)

## 2018-07-23 LAB — COMPREHENSIVE METABOLIC PANEL
ALT: 14 U/L (ref 0–44)
ANION GAP: 16 — AB (ref 5–15)
AST: 14 U/L — AB (ref 15–41)
Albumin: 3.5 g/dL (ref 3.5–5.0)
Alkaline Phosphatase: 113 U/L (ref 38–126)
BILIRUBIN TOTAL: 0.8 mg/dL (ref 0.3–1.2)
BUN: 11 mg/dL (ref 6–20)
CHLORIDE: 92 mmol/L — AB (ref 98–111)
CO2: 23 mmol/L (ref 22–32)
Calcium: 9.3 mg/dL (ref 8.9–10.3)
Creatinine, Ser: 1.15 mg/dL (ref 0.61–1.24)
GFR calc Af Amer: >60 mL/min (ref >60)
Glucose, Bld: 459 mg/dL — ABNORMAL HIGH (ref 70–99)
POTASSIUM: 3.7 mmol/L (ref 3.5–5.1)
Sodium: 131 mmol/L — ABNORMAL LOW (ref 135–145)
TOTAL PROTEIN: 8.2 g/dL — AB (ref 6.5–8.1)

## 2018-07-23 LAB — URINALYSIS, ROUTINE W REFLEX MICROSCOPIC
BACTERIA UA: NONE SEEN
Bilirubin Urine: NEGATIVE
Glucose, UA: >=500 mg/dL — AB
KETONES UR: 20 mg/dL — AB
Leukocytes, UA: NEGATIVE
NITRITE: NEGATIVE
Protein, ur: NEGATIVE mg/dL
SPECIFIC GRAVITY, URINE: 1.036 — AB (ref 1.005–1.030)
pH: 6 (ref 5.0–8.0)

## 2018-07-23 LAB — I-STAT CG4 LACTIC ACID, ED: LACTIC ACID, VENOUS: 1.8 mmol/L (ref 0.5–1.9)

## 2018-07-23 NOTE — ED Triage Notes (Signed)
Abscess rt shoulder for 3-4 days  He was ent here from urgent care on [address]   He was ent here for treatment  He is a diabetic

## 2018-07-24 LAB — CBG MONITORING, ED: Glucose-Capillary: 548 mg/dL (ref 70–99)

## 2018-07-24 MED ORDER — LIDOCAINE-EPINEPHRINE (PF) 2 %-1:200000 IJ SOLN
20.0000 mL | Freq: Once | INTRAMUSCULAR | Status: AC
  Administered 2018-07-24: 20 mL
  Filled 2018-07-24: qty 20

## 2018-07-24 MED ORDER — CEPHALEXIN 500 MG PO CAPS: 500.0000 mg | ORAL_CAPSULE | Freq: Four times a day (QID) | ORAL | 0 refills | Status: DC

## 2018-07-24 MED ORDER — SODIUM CHLORIDE 0.9 % IV BOLUS
1000.0000 mL | Freq: Once | INTRAVENOUS | Status: AC
  Administered 2018-07-24: 1000 mL via INTRAVENOUS

## 2018-07-24 MED ORDER — CEPHALEXIN 250 MG PO CAPS
500.0000 mg | ORAL_CAPSULE | Freq: Once | ORAL | Status: AC
  Administered 2018-07-24: 500 mg via ORAL
  Filled 2018-07-24: qty 2

## 2018-07-24 NOTE — ED Notes (Signed)
E-signature not available, pt verbalized understanding of DC instructions and prescriptions 

## 2018-07-24 NOTE — ED Notes (Signed)
Dr. Dina Rich aware CBG >600

## 2018-07-24 NOTE — ED Provider Notes (Signed)
[facility] Provider Note   CSN: [id number] Arrival date & time: 07/23/18  1907     History   Chief Complaint Chief Complaint  Patient presents with  . Abscess    HPI Tim Tate is a 37 y.o. male.  HPI  This is a 37 year old male with a history of diabetes who presents with abscess and cellulitis of the right upper back.  Patient reports 3 to 4-day history of abscess in this area.  He reports bumping his drainage.  No fevers.  He was seen in urgent care and referred here for hyperglycemia.  Reports that his blood sugars have been well controlled at home previously and were in the 100s range yesterday.  He has a history of abscess in the past.  Denies any chest pain, shortness breath, abdominal pain.  Ports compliance with his metformin.  He does not take insulin.  Past Medical History:  Diagnosis Date  . Diabetes mellitus without complication ([place])     There are no active problems to display for this patient.   Past Surgical History:  Procedure Laterality Date  . WRIST SURGERY          Home Medications    Prior to Admission medications   Medication Sig Start Date End Date Taking? Authorizing Provider  cephALEXin (KEFLEX) 500 MG capsule Take 1 capsule (500 mg total) by mouth 4 (four) times daily. 07/24/18   Horton, Barbette Hair, MD  Empagliflozin-Metformin HCl (SYNJARDY PO) Take by mouth.    [provider]  glimepiride (AMARYL) 1 MG tablet Take 1 tablet (1 mg total) by mouth daily before breakfast. 03/07/12 03/07/13  Chauncy Passy, MD  metFORMIN (GLUCOPHAGE) 500 MG tablet Take 1 tablet (500 mg total) by mouth at bedtime. 03/07/12 03/07/13  Chauncy Passy, MD  terbinafine (LAMISIL) 250 MG tablet Take 1 tablet (250 mg total) by mouth daily. 08/10/16   Gean Birchwood, DPM    Family History No family history on file.  Social History Social History   Tobacco Use  . Smoking status: Never Smoker  . Smokeless tobacco:  Never Used  Substance Use Topics  . Alcohol use: No  . Drug use: No     Allergies   Patient has no known allergies.   Review of Systems Review of Systems  Constitutional: Negative for fever.  Respiratory: Negative for shortness of breath.   Cardiovascular: Negative for chest pain.  Gastrointestinal: Negative for abdominal pain.  Genitourinary: Negative for dysuria.  Skin: Positive for wound.       Abscess  All other systems reviewed and are negative.    Physical Exam Updated Vital Signs BP (!) 141/89 (BP Location: Left Arm)   Pulse 95   Temp 99.5 F (37.5 C) (Oral)   Resp 16   Ht 1.753 m (5\' 9" )   Wt 93.4 kg   SpO2 97%   BMI 30.42 kg/m   Physical Exam Vitals signs and nursing note reviewed.  Constitutional:      Appearance: He is well-developed. He is not ill-appearing.  HENT:     Head: Normocephalic and atraumatic.  Eyes:     Pupils: Pupils are equal, round, and reactive to light.  Cardiovascular:     Rate and Rhythm: Normal rate and regular rhythm.     Heart sounds: Normal heart sounds. No murmur.  Pulmonary:     Effort: Pulmonary effort is normal. No respiratory distress.     Breath sounds: Normal breath sounds. No  wheezing.  Abdominal:     General: Bowel sounds are normal.     Palpations: Abdomen is soft.     Tenderness: There is no abdominal tenderness. There is no rebound.  Skin:    General: Skin is warm and dry.     Comments: Palpable abscess right upper back, spontaneous drainage noted, slight erythema surrounding  Neurological:     Mental Status: He is alert and oriented to person, place, and time.      ED Treatments / Results  Labs (all labs ordered are listed, but only abnormal results are displayed) Labs Reviewed  COMPREHENSIVE METABOLIC PANEL - Abnormal; Notable for the following components:      Result Value   Sodium 131 (*)    Chloride 92 (*)    Glucose, Bld 459 (*)    Total Protein 8.2 (*)    AST 14 (*)    Anion gap 16 (*)     All other components within normal limits  URINALYSIS, ROUTINE W REFLEX MICROSCOPIC - Abnormal; Notable for the following components:   Color, Urine STRAW (*)    Specific Gravity, Urine 1.036 (*)    Glucose, UA >=500 (*)    Hgb urine dipstick SMALL (*)    Ketones, ur 20 (*)    All other components within normal limits  CBG MONITORING, ED - Abnormal; Notable for the following components:   Glucose-Capillary >600 (*)    All other components within normal limits  CBG MONITORING, ED - Abnormal; Notable for the following components:   Glucose-Capillary 548 (*)    All other components within normal limits  CBC  I-STAT CG4 LACTIC ACID, ED    EKG None  Radiology No results found.  Procedures .Marland KitchenIncision and Drainage Date/Time: 07/24/2018 4:19 AM Performed by: Merryl Hacker, MD Authorized by: Merryl Hacker, MD   Consent:    Consent obtained:  Verbal   Consent given by:  Patient   Risks discussed:  Bleeding and incomplete drainage   Alternatives discussed:  No treatment Location:    Type:  Abscess   Size:  3 x3   Location:  Trunk   Trunk location:  Back Pre-procedure details:    Skin preparation:  Betadine Anesthesia (see MAR for exact dosages):    Anesthesia method:  Local infiltration   Local anesthetic:  Lidocaine 2% WITH epi Procedure type:    Complexity:  Simple Procedure details:    Needle aspiration: no     Incision types:  Stab incision   Scalpel blade:  11   Wound management:  Probed and deloculated   Drainage:  Purulent   Drainage amount:  Moderate   Packing materials:  1/4 in iodoform gauze Post-procedure details:    Patient tolerance of procedure:  Tolerated well, no immediate complications   (including critical care time)  Medications Ordered in ED Medications  sodium chloride 0.9 % bolus 1,000 mL (0 mLs Intravenous Stopped 07/24/18 0352)  lidocaine-EPINEPHrine (XYLOCAINE W/EPI) 2 %-1:200000 (PF) injection 20 mL (20 mLs Infiltration Given  07/24/18 0238)  cephALEXin (KEFLEX) capsule 500 mg (500 mg Oral Given 07/24/18 0401)     Initial Impression / Assessment and Plan / ED Course  I have reviewed the triage vital signs and the nursing notes.  Pertinent labs & imaging results that were available during my care of the patient were reviewed by me and considered in my medical decision making (see chart for details).     Patient presents with what appears to be  abscess and early cellulitis of the right upper back.  He is nontoxic-appearing vital signs are reassuring.  Notably hyperglycemic.  Metabolic panel with a slight gap at 16.  However, his chloride is also significantly low at 92.  Do not feel he is in DKA.  Patient was given 2 L of fluid.  Abscess was I&D.  Patient was given Keflex.  He has no significant leukocytosis.  He is afebrile.  He reports compliance with metformin.  He will need close monitoring of his blood sugars and follow-up with his primary doctor for any medications adjustments.  Packing was placed and he needs to have this removed in 2 days and recheck.  Will be discharged on Keflex.  After history, exam, and medical workup I feel the patient has been appropriately medically screened and is safe for discharge home. Pertinent diagnoses were discussed with the patient. Patient was given return precautions.   Final Clinical Impressions(s) / ED Diagnoses   Final diagnoses:  Cellulitis and abscess of trunk  Hyperglycemia    ED Discharge Orders         Ordered    cephALEXin (KEFLEX) 500 MG capsule  4 times daily,   Status:  Discontinued     07/24/18 0333    cephALEXin (KEFLEX) 500 MG capsule  4 times daily     07/24/18 0417           Horton, Barbette Hair, MD 07/24/18 [phone]

## 2018-07-24 NOTE — Discharge Instructions (Signed)
You were seen today for an abscess and cellulitis of the right upper back.  You will be started on antibiotics.  Keep packing in place for the next 2 to 3 days.  If you develop fevers or worsening symptoms you need to be reevaluated.  You were notably hyperglycemic.  This may be related to infection.  Monitor closely at home.  Follow-up with your primary doctor for medication adjustments.

## 2019-07-11 DIAGNOSIS — Z20828 Contact with and (suspected) exposure to other viral communicable diseases: Secondary | ICD-10-CM | POA: Diagnosis not present

## 2019-07-11 DIAGNOSIS — R05 Cough: Secondary | ICD-10-CM | POA: Diagnosis not present

## 2020-05-03 ENCOUNTER — Encounter (HOSPITAL_COMMUNITY): Payer: Self-pay | Admitting: Emergency Medicine

## 2020-05-03 ENCOUNTER — Other Ambulatory Visit: Payer: Self-pay

## 2020-05-03 ENCOUNTER — Ambulatory Visit (HOSPITAL_COMMUNITY)
Admission: EM | Admit: 2020-05-03 | Discharge: 2020-05-03 | Disposition: A | Discharge status: Home / Self Care | Payer: BC Managed Care – PPO | Attending: Internal Medicine | Admitting: Internal Medicine

## 2020-05-03 DIAGNOSIS — M79605 Pain in left leg: Secondary | ICD-10-CM | POA: Diagnosis not present

## 2020-05-03 MED ORDER — IBUPROFEN 600 MG PO TABS: 600.0000 mg | ORAL_TABLET | Freq: Four times a day (QID) | ORAL | 0 refills | Status: DC | PRN

## 2020-05-03 NOTE — ED Triage Notes (Addendum)
Pt presents for left leg pain. States that it started over a week ago and has not been a constant pain. Pt c/o that leg is swollen and warm to touch. Pt states that he has used biofreeze, motrin, elevation and cold compresses states that therapies were successful. Pt denies injury.

## 2020-05-03 NOTE — Discharge Instructions (Signed)
Icing o f the left leg as well as gentle stretching. Take medications as directed If symptoms worsen ,please return to urgent care to be re-evaluated

## 2020-05-04 ENCOUNTER — Emergency Department (HOSPITAL_COMMUNITY)
Admission: EM | Admit: 2020-05-04 | Discharge: 2020-05-05 | Disposition: A | Discharge status: Home / Self Care | Payer: BC Managed Care – PPO | Attending: Emergency Medicine | Admitting: Emergency Medicine

## 2020-05-04 ENCOUNTER — Encounter (HOSPITAL_COMMUNITY): Payer: Self-pay | Admitting: Emergency Medicine

## 2020-05-04 ENCOUNTER — Ambulatory Visit (HOSPITAL_COMMUNITY)
Admission: EM | Admit: 2020-05-04 | Discharge: 2020-05-04 | Disposition: A | Discharge status: Home / Self Care | Payer: BC Managed Care – PPO | Attending: Family Medicine | Admitting: Family Medicine

## 2020-05-04 ENCOUNTER — Other Ambulatory Visit: Payer: Self-pay

## 2020-05-04 ENCOUNTER — Encounter (HOSPITAL_COMMUNITY): Payer: Self-pay

## 2020-05-04 DIAGNOSIS — Z7984 Long term (current) use of oral hypoglycemic drugs: Secondary | ICD-10-CM | POA: Diagnosis not present

## 2020-05-04 DIAGNOSIS — L03116 Cellulitis of left lower limb: Secondary | ICD-10-CM | POA: Insufficient documentation

## 2020-05-04 DIAGNOSIS — M25562 Pain in left knee: Secondary | ICD-10-CM | POA: Diagnosis not present

## 2020-05-04 DIAGNOSIS — R600 Localized edema: Secondary | ICD-10-CM

## 2020-05-04 DIAGNOSIS — E119 Type 2 diabetes mellitus without complications: Secondary | ICD-10-CM | POA: Diagnosis not present

## 2020-05-04 DIAGNOSIS — L0390 Cellulitis, unspecified: Secondary | ICD-10-CM

## 2020-05-04 DIAGNOSIS — M79605 Pain in left leg: Secondary | ICD-10-CM | POA: Diagnosis not present

## 2020-05-04 LAB — CBG MONITORING, ED: Glucose-Capillary: 361 mg/dL — ABNORMAL HIGH (ref 70–99)

## 2020-05-04 MED ORDER — TRAMADOL HCL 50 MG PO TABS: 50.0000 mg | ORAL_TABLET | Freq: Three times a day (TID) | ORAL | 0 refills | Status: DC | PRN

## 2020-05-04 NOTE — ED Triage Notes (Signed)
Pt present left leg pain and swelling, symptoms started a week ago. Pt is unable to bear weight on his leg .

## 2020-05-04 NOTE — ED Provider Notes (Signed)
[facility]    CSN: [id number] Arrival date & time: 05/03/20  1008      History   Chief Complaint Chief Complaint  Patient presents with  . Leg Pain    HPI Tim Tate is a 39 y.o. male comes to the urgent care with complaint of left leg pain.  Pain started about a week ago.  It has been intermittent especially on the days that he is working.  He operates a [employer].  Patient says that the leg he has noticed some focal swelling in the midportion of the left leg.  It was slightly warm to touch and without erythema.  He has tried to use Biofreeze, Motrin and cold compress.  These measures sometimes help.  Patient denies any injury.  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   HPI  Past Medical History:  Diagnosis Date  . Diabetes mellitus without complication ([place])    Type 2    There are no problems to display for this patient.   Past Surgical History:  Procedure Laterality Date  . WRIST SURGERY         Home Medications    Prior to Admission medications   Medication Sig Start Date End Date Taking? Authorizing Provider  ibuprofen (ADVIL) 600 MG tablet Take 1 tablet (600 mg total) by mouth every 6 (six) hours as needed. 05/03/20   Imogene Gravelle, Myrene Galas, MD  glimepiride (AMARYL) 1 MG tablet Take 1 tablet (1 mg total) by mouth daily before breakfast. 03/07/12 05/03/20  Chauncy Passy, MD  metFORMIN (GLUCOPHAGE) 500 MG tablet Take 1 tablet (500 mg total) by mouth at bedtime. 03/07/12 05/03/20  Chauncy Passy, MD    Family History History reviewed. No pertinent family history.  Social History Social History   Tobacco Use  . Smoking status: Never Smoker  . Smokeless tobacco: Never Used  Substance Use Topics  . Alcohol use: No  . Drug use: No     Allergies   Patient has no known allergies.   Review of Systems Review of Systems  Constitutional: Negative.   HENT: Negative.   Genitourinary: Negative.   Musculoskeletal:  Positive for myalgias. Negative for arthralgias, back pain and gait problem.  Neurological: Negative.      Physical Exam Triage Vital Signs ED Triage Vitals  Enc Vitals Group     BP 05/03/20 1036 140/87     Pulse Rate 05/03/20 1036 80     Resp 05/03/20 1036 15     Temp 05/03/20 1036 98.4 F (36.9 C)     Temp Source 05/03/20 1036 Oral     SpO2 05/03/20 1036 98 %     Weight --      Height --      Head Circumference --      Peak Flow --      Pain Score 05/03/20 1033 6     Pain Loc --      Pain Edu? --      Excl. in [place]? --    No data found.  Updated Vital Signs BP 140/87 (BP Location: Right Arm)   Pulse 80   Temp 98.4 F (36.9 C) (Oral)   Resp 15   SpO2 98%   Visual Acuity Right Eye Distance:   Left Eye Distance:   Bilateral Distance:    Right Eye Near:   Left Eye Near:    Bilateral Near:  Physical Exam Vitals and nursing note reviewed.  Constitutional:      General: He is not in acute distress.    Appearance: Normal appearance. He is not ill-appearing.  Cardiovascular:     Rate and Rhythm: Normal rate and regular rhythm.     Pulses: Normal pulses.  Pulmonary:     Effort: Pulmonary effort is normal.     Breath sounds: Normal breath sounds.  Musculoskeletal:     Comments: Focal tenderness in the midportion of the left leg medial to the tibia.  No swelling.  No erythema.  Skin:    General: Skin is warm.     Capillary Refill: Capillary refill takes less than 2 seconds.     Coloration: Skin is not pale.     Findings: No bruising, erythema, lesion or rash.  Neurological:     General: No focal deficit present.     Mental Status: He is alert and oriented to person, place, and time.      UC Treatments / Results  Labs (all labs ordered are listed, but only abnormal results are displayed) Labs Reviewed - No data to display  EKG   Radiology No results found.  Procedures Procedures (including critical care time)  Medications Ordered in  UC Medications - No data to display  Initial Impression / Assessment and Plan / UC Course  I have reviewed the triage vital signs and the nursing notes.  Pertinent labs & imaging results that were available during my care of the patient were reviewed by me and considered in my medical decision making (see chart for details).     Musculoskeletal pain in the midportion of the left leg. No calf tenderness Ibuprofen 600 mg every 6 hours as needed for pain Rest, icing, gentle range of motion exercises and stretching will help If symptoms worsen or if patient experiences worsening swelling- he is advised to return to the urgent care to be reevaluated. Final Clinical Impressions(s) / UC Diagnoses   Final diagnoses:  Left leg pain     Discharge Instructions     Icing o f the left leg as well as gentle stretching. Take medications as directed If symptoms worsen ,please return to urgent care to be re-evaluated   ED Prescriptions    Medication Sig Dispense Auth. Provider   ibuprofen (ADVIL) 600 MG tablet Take 1 tablet (600 mg total) by mouth every 6 (six) hours as needed. 30 tablet Juliocesar Blasius, Myrene Galas, MD     PDMP not reviewed this encounter.   Chase Picket, MD 05/04/20 [phone]

## 2020-05-04 NOTE — ED Notes (Signed)
361 ml CBG

## 2020-05-04 NOTE — Discharge Instructions (Signed)
Call first thing tomorrow morning to set up the ultrasound for your leg to rule out a blood clot. [phone]. If anything worsens at any time, please go directly to the ER

## 2020-05-04 NOTE — ED Triage Notes (Signed)
Pt c/o LLE pain/swelling x 1.5 weeks. Edema noted from mid calf to foot. Pt was seen at UC told to come to ED for ?u/s to r/o DVT.

## 2020-05-05 ENCOUNTER — Emergency Department (HOSPITAL_BASED_OUTPATIENT_CLINIC_OR_DEPARTMENT_OTHER): Payer: BC Managed Care – PPO

## 2020-05-05 DIAGNOSIS — M7989 Other specified soft tissue disorders: Secondary | ICD-10-CM | POA: Diagnosis not present

## 2020-05-05 DIAGNOSIS — M79609 Pain in unspecified limb: Secondary | ICD-10-CM

## 2020-05-05 DIAGNOSIS — R609 Edema, unspecified: Secondary | ICD-10-CM | POA: Diagnosis not present

## 2020-05-05 LAB — CBC WITH DIFFERENTIAL/PLATELET
Abs Immature Granulocytes: 0.04 10*3/uL (ref 0.00–0.07)
Basophils Absolute: 0 10*3/uL (ref 0.0–0.1)
Basophils Relative: 0 %
Eosinophils Absolute: 0 10*3/uL (ref 0.0–0.5)
Eosinophils Relative: 0 %
HCT: 38.3 % — ABNORMAL LOW (ref 39.0–52.0)
Hemoglobin: 13.2 g/dL (ref 13.0–17.0)
Immature Granulocytes: 0 %
Lymphocytes Relative: 6 %
Lymphs Abs: 0.7 10*3/uL (ref 0.7–4.0)
MCH: 28 pg (ref 26.0–34.0)
MCHC: 34.5 g/dL (ref 30.0–36.0)
MCV: 81.3 fL (ref 80.0–100.0)
Monocytes Absolute: 0.8 10*3/uL (ref 0.1–1.0)
Monocytes Relative: 8 %
Neutro Abs: 9.4 10*3/uL — ABNORMAL HIGH (ref 1.7–7.7)
Neutrophils Relative %: 86 %
Platelets: 267 10*3/uL (ref 150–400)
RBC: 4.71 MIL/uL (ref 4.22–5.81)
RDW: 11.9 % (ref 11.5–15.5)
WBC: 11 10*3/uL — ABNORMAL HIGH (ref 4.0–10.5)
nRBC: 0 % (ref 0.0–0.2)

## 2020-05-05 LAB — BASIC METABOLIC PANEL
Anion gap: 13 (ref 5–15)
BUN: 7 mg/dL (ref 6–20)
CO2: 23 mmol/L (ref 22–32)
Calcium: 9.2 mg/dL (ref 8.9–10.3)
Chloride: 100 mmol/L (ref 98–111)
Creatinine, Ser: 0.84 mg/dL (ref 0.61–1.24)
GFR calc non Af Amer: >60 mL/min (ref >60)
Glucose, Bld: 264 mg/dL — ABNORMAL HIGH (ref 70–99)
Potassium: 3.7 mmol/L (ref 3.5–5.1)
Sodium: 136 mmol/L (ref 135–145)

## 2020-05-05 LAB — URINALYSIS, ROUTINE W REFLEX MICROSCOPIC
Bacteria, UA: NONE SEEN
Bilirubin Urine: NEGATIVE
Glucose, UA: >=500 mg/dL — AB
Ketones, ur: 80 mg/dL — AB
Leukocytes,Ua: NEGATIVE
Nitrite: NEGATIVE
Protein, ur: 30 mg/dL — AB
Specific Gravity, Urine: 1.038 — ABNORMAL HIGH (ref 1.005–1.030)
pH: 5 (ref 5.0–8.0)

## 2020-05-05 LAB — CBG MONITORING, ED
Glucose-Capillary: 245 mg/dL — ABNORMAL HIGH (ref 70–99)
Glucose-Capillary: 309 mg/dL — ABNORMAL HIGH (ref 70–99)

## 2020-05-05 MED ORDER — SULFAMETHOXAZOLE-TRIMETHOPRIM 800-160 MG PO TABS: 1.0000 | ORAL_TABLET | Freq: Two times a day (BID) | ORAL | 0 refills | Status: AC

## 2020-05-05 MED ORDER — CEFAZOLIN SODIUM-DEXTROSE 1-4 GM/50ML-% IV SOLN
1.0000 g | Freq: Once | INTRAVENOUS | Status: AC
  Administered 2020-05-05: 1 g via INTRAVENOUS
  Filled 2020-05-05: qty 50

## 2020-05-05 MED ORDER — CEPHALEXIN 500 MG PO CAPS: 500.0000 mg | ORAL_CAPSULE | Freq: Four times a day (QID) | ORAL | 0 refills | Status: AC

## 2020-05-05 MED ORDER — MORPHINE SULFATE (PF) 4 MG/ML IV SOLN
4.0000 mg | Freq: Once | INTRAVENOUS | Status: AC
  Administered 2020-05-05: 4 mg via INTRAVENOUS
  Filled 2020-05-05: qty 1

## 2020-05-05 MED ORDER — ACETAMINOPHEN 325 MG PO TABS
650.0000 mg | ORAL_TABLET | Freq: Once | ORAL | Status: AC
  Administered 2020-05-05: 650 mg via ORAL
  Filled 2020-05-05: qty 2

## 2020-05-05 MED ORDER — SODIUM CHLORIDE 0.9 % IV BOLUS
1000.0000 mL | Freq: Once | INTRAVENOUS | Status: AC
  Administered 2020-05-05: 1000 mL via INTRAVENOUS

## 2020-05-05 NOTE — ED Provider Notes (Signed)
[facility] EMERGENCY DEPARTMENT Provider Note   CSN: [id number] Arrival date & time: 05/04/20  2221     History Chief Complaint  Patient presents with  . Leg Pain    Tim Tate is a 39 y.o. male.  HPI   39 year old male with a history of diabetes presenting the emergency department today complaining of left lower swelling.  Pain has been present for the last 1.5 weeks.  Swelling is gotten worse since onset.  It is located to the calf.  He was seen in urgent care recently and started on ibuprofen which he states he took with no relief.  He only continued to have swelling following taking this medication.  He represented to urgent care again a few days ago and he was advised to come to the ED for further evaluation to rule out DVT.  He denies any fevers at home or any systemic symptoms such as chest pain, shortness of breath, vomiting.  He has been compliant with his insulin except he has not taken his dose today due to being in the emergency department.  Past Medical History:  Diagnosis Date  . Diabetes mellitus without complication ([place])    Type 2    There are no problems to display for this patient.   Past Surgical History:  Procedure Laterality Date  . WRIST SURGERY         No family history on file.  Social History   Tobacco Use  . Smoking status: Never Smoker  . Smokeless tobacco: Never Used  Substance Use Topics  . Alcohol use: No  . Drug use: No    Home Medications Prior to Admission medications   Medication Sig Start Date End Date Taking? Authorizing Provider  cephALEXin (KEFLEX) 500 MG capsule Take 1 capsule (500 mg total) by mouth 4 (four) times daily for 7 days. 05/05/20 05/12/20  Furqan Gosselin S, PA-C  ibuprofen (ADVIL) 600 MG tablet Take 1 tablet (600 mg total) by mouth every 6 (six) hours as needed. 05/03/20   Lamptey, Myrene Galas, MD  sulfamethoxazole-trimethoprim (BACTRIM DS) 800-160 MG tablet Take 1 tablet by mouth 2 (two)  times daily for 7 days. 05/05/20 05/12/20  Marchella Hibbard S, PA-C  traMADol (ULTRAM) 50 MG tablet Take 1 tablet (50 mg total) by mouth 3 (three) times daily as needed. 05/04/20   Volney American, PA-C  glimepiride (AMARYL) 1 MG tablet Take 1 tablet (1 mg total) by mouth daily before breakfast. 03/07/12 05/03/20  Chauncy Passy, MD  metFORMIN (GLUCOPHAGE) 500 MG tablet Take 1 tablet (500 mg total) by mouth at bedtime. 03/07/12 05/03/20  Chauncy Passy, MD    Allergies    Patient has no known allergies.  Review of Systems   Review of Systems  Constitutional: Negative for fever.  HENT: Negative for ear pain and sore throat.   Eyes: Negative for visual disturbance.  Respiratory: Negative for cough and shortness of breath.   Cardiovascular: Positive for leg swelling. Negative for chest pain.  Gastrointestinal: Negative for abdominal pain, constipation, diarrhea, nausea and vomiting.  Genitourinary: Negative for dysuria and hematuria.  Musculoskeletal:       Left leg pain  Skin: Positive for color change.  Neurological: Negative for headaches.  All other systems reviewed and are negative.   Physical Exam Updated Vital Signs BP (!) 136/91 (BP Location: Right Arm)   Pulse (!) 115   Temp 98.5 F (36.9 C) (Oral)   Resp 14   SpO2 100%  Physical Exam Vitals and nursing note reviewed.  Constitutional:      Appearance: He is well-developed.  HENT:     Head: Normocephalic and atraumatic.  Eyes:     Conjunctiva/sclera: Conjunctivae normal.  Cardiovascular:     Rate and Rhythm: Regular rhythm. Tachycardia present.     Heart sounds: Normal heart sounds. No murmur heard.   Pulmonary:     Effort: Pulmonary effort is normal. No respiratory distress.     Breath sounds: Normal breath sounds. No wheezing, rhonchi or rales.  Abdominal:     General: Bowel sounds are normal.     Palpations: Abdomen is soft.     Tenderness: There is no abdominal tenderness.  Musculoskeletal:     Cervical back:  Neck supple.     Comments: LLE swelling noted to the mid shin and calf. Erythema and warmth noted as well. DP pulses 2+/symmetric. No obvious edema. No crepitus.   Skin:    General: Skin is warm and dry.  Neurological:     Mental Status: He is alert.     ED Results / Procedures / Treatments   Labs (all labs ordered are listed, but only abnormal results are displayed) Labs Reviewed  CBC WITH DIFFERENTIAL/PLATELET - Abnormal; Notable for the following components:      Result Value   WBC 11.0 (*)    HCT 38.3 (*)    Neutro Abs 9.4 (*)    All other components within normal limits  BASIC METABOLIC PANEL - Abnormal; Notable for the following components:   Glucose, Bld 264 (*)    All other components within normal limits  URINALYSIS, ROUTINE W REFLEX MICROSCOPIC - Abnormal; Notable for the following components:   Specific Gravity, Urine 1.038 (*)    Glucose, UA >=500 (*)    Hgb urine dipstick SMALL (*)    Ketones, ur 80 (*)    Protein, ur 30 (*)    All other components within normal limits  CBG MONITORING, ED - Abnormal; Notable for the following components:   Glucose-Capillary 361 (*)    All other components within normal limits  CBG MONITORING, ED - Abnormal; Notable for the following components:   Glucose-Capillary 245 (*)    All other components within normal limits  CBG MONITORING, ED - Abnormal; Notable for the following components:   Glucose-Capillary 309 (*)    All other components within normal limits    EKG None  Radiology VAS [place] LOWER EXTREMITY VENOUS (DVT) ([facility] and [facility] 7a-7p)  Result Date: 05/05/2020  Lower Venous DVTStudy Indications: Swelling, Edema, and Pain.  Performing Technologist: Antonieta Pert RDMS, RVT  Examination Guidelines: A complete evaluation includes B-mode imaging, spectral Doppler, color Doppler, and power Doppler as needed of all accessible portions of each vessel. Bilateral testing is considered an integral part of a complete examination. Limited  examinations for reoccurring indications may be performed as noted. The reflux portion of the exam is performed with the patient in reverse Trendelenburg.  +-----+---------------+---------+-----------+----------+--------------+ RIGHTCompressibilityPhasicitySpontaneityPropertiesThrombus Aging +-----+---------------+---------+-----------+----------+--------------+ CFV  Full           Yes      Yes                                 +-----+---------------+---------+-----------+----------+--------------+   +---------+---------------+---------+-----------+----------+--------------+ LEFT     CompressibilityPhasicitySpontaneityPropertiesThrombus Aging +---------+---------------+---------+-----------+----------+--------------+ CFV      Full           Yes  Yes                                 +---------+---------------+---------+-----------+----------+--------------+ SFJ      Full                                                        +---------+---------------+---------+-----------+----------+--------------+ FV Prox  Full                                                        +---------+---------------+---------+-----------+----------+--------------+ FV Mid   Full                                                        +---------+---------------+---------+-----------+----------+--------------+ FV DistalFull                                                        +---------+---------------+---------+-----------+----------+--------------+ PFV      Full                                                        +---------+---------------+---------+-----------+----------+--------------+ POP      Full           Yes      Yes                                 +---------+---------------+---------+-----------+----------+--------------+ PTV      Full                                                         +---------+---------------+---------+-----------+----------+--------------+ PERO     Full                                                        +---------+---------------+---------+-----------+----------+--------------+ GSV      Full                                                        +---------+---------------+---------+-----------+----------+--------------+     Summary: RIGHT: - No evidence of common femoral vein obstruction.  LEFT: - There is no evidence of deep vein thrombosis in the lower extremity.  - No cystic structure found in the popliteal fossa.  *See table(s) above for measurements and observations. Electronically signed by Servando Snare MD on 05/05/2020 at 12:16:53 PM.    Final     Procedures Procedures (including critical care time)  Medications Ordered in ED Medications  sodium chloride 0.9 % bolus 1,000 mL (0 mLs Intravenous Stopped 05/05/20 1043)  acetaminophen (TYLENOL) tablet 650 mg (650 mg Oral Given 05/05/20 0940)  morphine 4 MG/ML injection 4 mg (4 mg Intravenous Given 05/05/20 0940)  ceFAZolin (ANCEF) IVPB 1 g/50 mL premix (1 g Intravenous New Bag/Given 05/05/20 1145)    ED Course  I have reviewed the triage vital signs and the nursing notes.  Pertinent labs & imaging results that were available during my care of the patient were reviewed by me and considered in my medical decision making (see chart for details).    MDM Rules/Calculators/A&P                         39 year old male with history of diabetes presenting for evaluation of left lower extremity pain/swelling ongoing for the last 1.5 weeks.  Reviewed/interpreted labs Initial CBG greater than 300, repeat CBG 245 CBC leukocytosis of 11,000 BMP with elevated CBG, normal bicarb and no elevated anion gap to suggest DKA UA w/u evidence of UTI. Some ketones noted but have very low suspicion for DKA  LLE [place] -no evidence of DVT noted in the lower extremity.  Patient given fluid bolus, pain  medications, Tylenol.  Heart rate improved following administration of this and blood sugar is also improved throughout his ED visit.  Work-up here is negative for DVT.  I suspect symptoms are caused by cellulitis.  No evidence of deep space infection on my evaluation and patient afebrile, nontoxic.  Does not appear to have sepsis.  Will treat with antibiotics.  Advised on 48-hour recheck and to return sooner if symptoms worsen.  He voiced understanding of plan reasons return to emergency.  Patient stable for discharge.  Final Clinical Impression(s) / ED Diagnoses Final diagnoses:  Cellulitis, unspecified cellulitis site    Rx / DC Orders ED Discharge Orders         Ordered    cephALEXin (KEFLEX) 500 MG capsule  4 times daily        05/05/20 1224    sulfamethoxazole-trimethoprim (BACTRIM DS) 800-160 MG tablet  2 times daily        05/05/20 [address] [place], PA-C 05/05/20 1226    Maudie Flakes, MD 05/07/20 2256

## 2020-05-05 NOTE — Discharge Instructions (Signed)
You were given antibiotics to treat a skin infection.  Please monitor your symptoms closely.  You should follow-up in 48 hours for recheck with either your regular doctor, urgent care or the emergency department.  If your symptoms significantly worsen prior to that you develop fevers, increased pain redness or swelling in the leg then you should return sooner to be seen.

## 2020-05-05 NOTE — ED Notes (Signed)
Patient verbalizes understanding of discharge instructions. Opportunity for questioning and answers were provided. Arm band removed by staff, patient discharged from ED. 

## 2020-05-05 NOTE — Progress Notes (Signed)
Inpatient Diabetes Program Recommendations  AACE/ADA: New Consensus Statement on Inpatient Glycemic Control (2015)  Target Ranges:  Prepandial:   less than 140 mg/dL      Peak postprandial:   less than 180 mg/dL (1-2 hours)      Critically ill patients:  140 - 180 mg/dL   Lab Results  Component Value Date   GLUCAP 361 (H) 05/04/2020    Review of Glycemic Control Results for Tim Tate, Tim Tate (MRN [id number]) as of 05/05/2020 10:03  Ref. Range 05/04/2020 22:59  Glucose-Capillary Latest Ref Range: 70 - 99 mg/dL 361 (H)    Diabetes history: Type 2 DM Outpatient Diabetes medications: Amaryl 1 mg QD, Metformin 500 mg QHS Current orders for Inpatient glycemic control: none  Inpatient Diabetes Program Recommendations:    No noted A1C on file. In 2019, glucose serum was >400 mg/dL.  If to be admitted, will follow.    Thanks, Bronson Curb, MSN, RNC-OB Diabetes Coordinator [phone] (8a-5p)

## 2020-05-05 NOTE — ED Provider Notes (Addendum)
[facility]    CSN: [id number] Arrival date & time: 05/04/20  1811      History   Chief Complaint Chief Complaint  Patient presents with  . Leg Swelling    left leg    HPI Tim Tate is a 39 y.o. male.   Patient presenting with about a week of progressively worsening left medial lower leg swelling and pain. Was seen at [facility] yesterday, given ibuprofen and supportive home care with heat, stretches but notes this has not helped and now can barely stand on the leg due to the pain worsening. Denies fever, chills, CP, SOB, palpitations, injury to area, hx of coagulopathy, recent travel or surgeries.      Past Medical History:  Diagnosis Date  . Diabetes mellitus without complication ([place])    Type 2    There are no problems to display for this patient.   Past Surgical History:  Procedure Laterality Date  . WRIST SURGERY         Home Medications    Prior to Admission medications   Medication Sig Start Date End Date Taking? Authorizing Provider  cephALEXin (KEFLEX) 500 MG capsule Take 1 capsule (500 mg total) by mouth 4 (four) times daily for 7 days. 05/05/20 05/12/20  Couture, Cortni S, PA-C  ibuprofen (ADVIL) 600 MG tablet Take 1 tablet (600 mg total) by mouth every 6 (six) hours as needed. 05/03/20   Lamptey, Myrene Galas, MD  sulfamethoxazole-trimethoprim (BACTRIM DS) 800-160 MG tablet Take 1 tablet by mouth 2 (two) times daily for 7 days. 05/05/20 05/12/20  Couture, Cortni S, PA-C  traMADol (ULTRAM) 50 MG tablet Take 1 tablet (50 mg total) by mouth 3 (three) times daily as needed. 05/04/20   Volney American, PA-C  glimepiride (AMARYL) 1 MG tablet Take 1 tablet (1 mg total) by mouth daily before breakfast. 03/07/12 05/03/20  Chauncy Passy, MD  metFORMIN (GLUCOPHAGE) 500 MG tablet Take 1 tablet (500 mg total) by mouth at bedtime. 03/07/12 05/03/20  Chauncy Passy, MD    Family History History reviewed. No pertinent family history.  Social History Social  History   Tobacco Use  . Smoking status: Never Smoker  . Smokeless tobacco: Never Used  Substance Use Topics  . Alcohol use: No  . Drug use: No     Allergies   Patient has no known allergies.   Review of Systems Review of Systems PER HPI   Physical Exam Triage Vital Signs ED Triage Vitals  Enc Vitals Group     BP 05/04/20 2002 (!) 162/100     Pulse Rate 05/04/20 2002 (!) 112     Resp 05/04/20 2002 20     Temp 05/04/20 2002 98.3 F (36.8 C)     Temp Source 05/04/20 2002 Oral     SpO2 05/04/20 2002 100 %     Weight --      Height --      Head Circumference --      Peak Flow --      Pain Score 05/04/20 2003 9     Pain Loc --      Pain Edu? --      Excl. in [place]? --    No data found.  Updated Vital Signs BP (!) 162/100 (BP Location: Right Arm)   Pulse (!) 112   Temp 98.3 F (36.8 C) (Oral)   Resp 20   SpO2 100%   Visual Acuity Right Eye Distance:   Left Eye Distance:  Bilateral Distance:    Right Eye Near:   Left Eye Near:    Bilateral Near:     Physical Exam Vitals and nursing note reviewed.  Constitutional:      Appearance: Normal appearance.  HENT:     Head: Atraumatic.     Mouth/Throat:     Mouth: Mucous membranes are moist.  Eyes:     Extraocular Movements: Extraocular movements intact.     Conjunctiva/sclera: Conjunctivae normal.  Cardiovascular:     Rate and Rhythm: Regular rhythm. Tachycardia present.     Pulses: Normal pulses.     Heart sounds: Normal heart sounds.  Pulmonary:     Effort: Pulmonary effort is normal. No respiratory distress.     Breath sounds: Normal breath sounds. No wheezing or rales.     Comments: Breathing comfortably Musculoskeletal:        General: Tenderness (2-3 inch diameter oval shaped area left medial lower leg significantly ttp, mildly edematous, no discoloration or warmth noted) present. No deformity. Normal range of motion.     Cervical back: Normal range of motion and neck supple.  Skin:    General:  Skin is warm and dry.     Findings: No bruising or erythema.  Neurological:     General: No focal deficit present.     Mental Status: He is oriented to person, place, and time.     Sensory: No sensory deficit.     Comments: B/l LEs neurovascularly intact  Psychiatric:        Mood and Affect: Mood normal.        Thought Content: Thought content normal.        Judgment: Judgment normal.    UC Treatments / Results  Labs (all labs ordered are listed, but only abnormal results are displayed) Labs Reviewed - No data to display  EKG   Radiology VAS [place] LOWER EXTREMITY VENOUS (DVT) ([facility] and [facility] 7a-7p)  Result Date: 05/05/2020  Lower Venous DVTStudy Indications: Swelling, Edema, and Pain.  Performing Technologist: Antonieta Pert RDMS, RVT  Examination Guidelines: A complete evaluation includes B-mode imaging, spectral Doppler, color Doppler, and power Doppler as needed of all accessible portions of each vessel. Bilateral testing is considered an integral part of a complete examination. Limited examinations for reoccurring indications may be performed as noted. The reflux portion of the exam is performed with the patient in reverse Trendelenburg.  +-----+---------------+---------+-----------+----------+--------------+ RIGHTCompressibilityPhasicitySpontaneityPropertiesThrombus Aging +-----+---------------+---------+-----------+----------+--------------+ CFV  Full           Yes      Yes                                 +-----+---------------+---------+-----------+----------+--------------+   +---------+---------------+---------+-----------+----------+--------------+ LEFT     CompressibilityPhasicitySpontaneityPropertiesThrombus Aging +---------+---------------+---------+-----------+----------+--------------+ CFV      Full           Yes      Yes                                 +---------+---------------+---------+-----------+----------+--------------+ SFJ      Full                                                         +---------+---------------+---------+-----------+----------+--------------+  FV Prox  Full                                                        +---------+---------------+---------+-----------+----------+--------------+ FV Mid   Full                                                        +---------+---------------+---------+-----------+----------+--------------+ FV DistalFull                                                        +---------+---------------+---------+-----------+----------+--------------+ PFV      Full                                                        +---------+---------------+---------+-----------+----------+--------------+ POP      Full           Yes      Yes                                 +---------+---------------+---------+-----------+----------+--------------+ PTV      Full                                                        +---------+---------------+---------+-----------+----------+--------------+ PERO     Full                                                        +---------+---------------+---------+-----------+----------+--------------+ GSV      Full                                                        +---------+---------------+---------+-----------+----------+--------------+     Summary: RIGHT: - No evidence of common femoral vein obstruction.  LEFT: - There is no evidence of deep vein thrombosis in the lower extremity.  - No cystic structure found in the popliteal fossa.  *See table(s) above for measurements and observations. Electronically signed by Servando Snare MD on 05/05/2020 at 12:16:53 PM.    Final     Procedures Procedures (including critical care time)  Medications Ordered in UC Medications - No data to display  Initial Impression / Assessment and Plan / UC Course  I have reviewed the triage vital signs and the nursing notes.  Pertinent labs & imaging  results that  were available during my care of the patient were reviewed by me and considered in my medical decision making (see chart for details).     Overall very well appearing apart from the leg pain, mildly tachycardic and hypertensive today in triage. Patient agreeable to a STAT DVT u/s to r/o deep clot, discussed may also be cyst or superficial thrombosis but w/o u/s it's difficult to determine. Number given to call first thing in AM as they are no longer open for the day which he is agreeable to. Discussed at length to go to the ER if sxs worsening in any way prior to this. Small supply of tramadol given for pain with precautions.    Final Clinical Impressions(s) / UC Diagnoses   Final diagnoses:  Leg edema  Arthralgia of left lower leg     Discharge Instructions     Call first thing tomorrow morning to set up the ultrasound for your leg to rule out a blood clot. [phone]. If anything worsens at any time, please go directly to the ER    ED Prescriptions    Medication Sig Dispense Auth. Provider   traMADol (ULTRAM) 50 MG tablet Take 1 tablet (50 mg total) by mouth 3 (three) times daily as needed. 15 tablet Volney American, [place]     I have reviewed the PDMP during this encounter.   Volney American, PA-C 05/05/20 [place], Matelyn Antonelli Cutten, [place] 05/05/20 1452

## 2020-05-08 DIAGNOSIS — Z20822 Contact with and (suspected) exposure to covid-19: Secondary | ICD-10-CM | POA: Diagnosis not present

## 2020-05-08 DIAGNOSIS — R600 Localized edema: Secondary | ICD-10-CM | POA: Diagnosis not present

## 2020-05-08 DIAGNOSIS — E119 Type 2 diabetes mellitus without complications: Secondary | ICD-10-CM | POA: Diagnosis not present

## 2020-05-08 DIAGNOSIS — Z23 Encounter for immunization: Secondary | ICD-10-CM | POA: Diagnosis not present

## 2020-05-08 DIAGNOSIS — L03116 Cellulitis of left lower limb: Secondary | ICD-10-CM | POA: Diagnosis not present

## 2020-05-08 DIAGNOSIS — M79662 Pain in left lower leg: Secondary | ICD-10-CM | POA: Diagnosis not present

## 2020-05-18 DIAGNOSIS — L0390 Cellulitis, unspecified: Secondary | ICD-10-CM | POA: Diagnosis not present

## 2020-05-18 DIAGNOSIS — E119 Type 2 diabetes mellitus without complications: Secondary | ICD-10-CM | POA: Diagnosis not present

## 2020-06-01 DIAGNOSIS — Z794 Long term (current) use of insulin: Secondary | ICD-10-CM | POA: Diagnosis not present

## 2020-06-01 DIAGNOSIS — L03116 Cellulitis of left lower limb: Secondary | ICD-10-CM | POA: Diagnosis not present

## 2020-06-01 DIAGNOSIS — E118 Type 2 diabetes mellitus with unspecified complications: Secondary | ICD-10-CM | POA: Diagnosis not present

## 2020-07-13 DIAGNOSIS — Z760 Encounter for issue of repeat prescription: Secondary | ICD-10-CM | POA: Diagnosis not present

## 2020-07-13 DIAGNOSIS — E119 Type 2 diabetes mellitus without complications: Secondary | ICD-10-CM | POA: Diagnosis not present

## 2020-08-25 ENCOUNTER — Telehealth: Payer: Self-pay

## 2020-08-25 NOTE — Telephone Encounter (Signed)
error 

## 2020-09-03 ENCOUNTER — Other Ambulatory Visit: Payer: Self-pay

## 2020-09-03 ENCOUNTER — Ambulatory Visit: Payer: BC Managed Care – PPO | Admitting: Nurse Practitioner

## 2020-09-03 ENCOUNTER — Encounter: Payer: Self-pay | Admitting: Nurse Practitioner

## 2020-09-03 VITALS — BP 122/84 | HR 107 | Temp 98.3°F | Ht 68.0 in | Wt 170.0 lb

## 2020-09-03 DIAGNOSIS — Z7689 Persons encountering health services in other specified circumstances: Secondary | ICD-10-CM | POA: Diagnosis not present

## 2020-09-03 DIAGNOSIS — Z794 Long term (current) use of insulin: Secondary | ICD-10-CM | POA: Diagnosis not present

## 2020-09-03 DIAGNOSIS — E119 Type 2 diabetes mellitus without complications: Secondary | ICD-10-CM

## 2020-09-03 LAB — POCT URINALYSIS DIPSTICK
Bilirubin, UA: NEGATIVE
Blood, UA: NEGATIVE
Glucose, UA: POSITIVE — AB
Ketones, UA: NEGATIVE
Leukocytes, UA: NEGATIVE
Nitrite, UA: NEGATIVE
Protein, UA: POSITIVE — AB
Spec Grav, UA: >=1.03 — AB (ref 1.010–1.025)
Urobilinogen, UA: 0.2 E.U./dL
pH, UA: 6 (ref 5.0–8.0)

## 2020-09-03 LAB — POCT UA - MICROALBUMIN
Albumin/Creatinine Ratio, Urine, POC: 30
Creatinine, POC: 300 mg/dL
Microalbumin Ur, POC: 30 mg/L

## 2020-09-03 MED ORDER — SITAGLIP PHOS-METFORMIN HCL ER 50-1000 MG PO TB24: 1.0000 | ORAL_TABLET | Freq: Every day | ORAL | 1 refills | Status: DC

## 2020-09-03 NOTE — Patient Instructions (Signed)
Type 2 Diabetes Mellitus, Diagnosis, Adult Type 2 diabetes (type 2 diabetes mellitus) is a long-term disease. It may happen when there is one or both of these problems:  The pancreas does not make enough insulin.  The body does not react in a normal way to insulin that it makes. Insulin lets sugars go into cells in your body. If you have type 2 diabetes, sugars cannot get into your cells. Sugars build up in the blood. This causes high blood sugar. What are the causes? The exact cause of this condition is not known. What increases the risk? The following factors may make you more likely to develop this condition:  Having type 2 diabetes in your family.  Being overweight or very overweight.  Not being active.  Your body not reacting in a normal way to the insulin it makes.  Having higher than normal blood sugar over time.  Having a type of diabetes when you were pregnant.  Having a condition that causes small fluid-filled sacs on your ovaries. What are the signs or symptoms? At first, you may have no symptoms. You will get symptoms slowly. They may include:  More thirst than normal.  More hunger than normal.  Needing to pee more than normal.  Losing weight without trying.  Feeling tired.  Feeling weak.  Seeing things blurry.  Dark patches on your skin. How is this treated? This condition may be treated by a diabetes expert. You may need to:  Follow an eating plan made by a food expert (dietitian).  Get regular exercise.  Find ways to deal with stress.  Check blood sugar as often as told.  Take medicines. Your doctor will set treatment goals for you. Your blood sugar should be at these levels:  Before meals: 80-130 mg/dL (4.4-7.2 mmol/L).  After meals: below 180 mg/dL (10 mmol/L).  Over the last 2-3 months: less than 7%. Follow these instructions at home: Medicines  Take your diabetes medicines or insulin every day.  Take medicines to help you not get  other problems caused by this condition. You may need: ? Aspirin. ? Medicine to lower cholesterol. ? Medicine to control blood pressure. Questions to ask your doctor  Should I meet with a diabetes educator?  What medicines do I need, and when should I take them?  What will I need to treat my condition at home?  When should I check my blood sugar?  Where can I find a support group?  Who can I call if I have questions?  When is my next doctor visit? General instructions  Take over-the-counter and prescription medicines only as told by your doctor.  Keep all follow-up visits as told by your doctor. This is important. Where to find more information  American Diabetes Association (ADA): www.diabetes.org  American Association of Diabetes Care and Education Specialists (ADCES): www.diabeteseducator.org  International Diabetes Federation (IDF): www.idf.org Contact a doctor if:  Your blood sugar is at or above 240 mg/dL (13.3 mmol/L) for 2 days in a row.  You have been sick for 2 days or more, and you are not getting better.  You have had a fever for 2 days or more, and you are not getting better.  You have any of these problems for more than 6 hours: ? You cannot eat or drink. ? You feel like you may vomit. ? You vomit. ? You have watery poop (diarrhea). Get help right away if:  Your blood sugar is very low. This means it is lower   than 54 mg/dL (3 mmol/L).  You feel mixed up (confused).  You have trouble thinking clearly.  You have trouble breathing.  You have medium or large ketone levels in your pee. These symptoms may be an emergency. Do not wait to see if the symptoms will go away. Get medical help right away. Call your local emergency services (911 in the [place] Do not drive yourself to the hospital. Summary  Type 2 diabetes is a long-term disease. Your pancreas may not make enough insulin, or your body may not react in a normal way to insulin that it  makes.  This condition is treated with an eating plan, lifestyle changes, and medicines.  Your doctor will set treatment goals for you. These will help you keep your blood sugar in a healthy range.  Keep all follow-up visits as told by your doctor. This is important. This information is not intended to replace advice given to you by your health care provider. Make sure you discuss any questions you have with your health care provider. Document Revised: 02/12/2020 Document Reviewed: 02/12/2020 Elsevier Patient Education  2021 Elsevier Inc.  

## 2020-09-03 NOTE — Progress Notes (Signed)
Rutherford Nail as a scribe for Minette Brine, FNP.,have documented all relevant documentation on the behalf of Minette Brine, FNP,as directed by  Minette Brine, FNP while in the presence of Minette Brine, [place]. This visit occurred during the SARS-CoV-2 public health emergency.  Safety protocols were in place, including screening questions prior to the visit, additional usage of staff PPE, and extensive cleaning of exam room while observing appropriate contact time as indicated for disinfecting solutions.  Subjective:     Patient ID: Tim Tate , male    DOB: May 20, 1981 , 40 y.o.   MRN: [id number]   Chief Complaint  Patient presents with  . Establish Care    HPI  Patient here today to establish care, he was referred by Priscille Loveless NP was going to [facility]. Works as a Government social research officer for [employer].  Single.  No children.  Masters in [employer] graduated from [employer].    PMH - Type 2 diabetes since age 65, Tyler Aas 16 units daily, last HgbA1c 7.4 - last seen in December.  Has never been hospitalized. He has tried metformin in the past tolerated well.  He reports he had cellulitis in September.  Since then he has had aching toes.  He has been out of insulin needles.    Denies increase thirst or increased hunger.   [facility] - father (unknown), mother - hypertension. Sister - healthy. Maternal grandfather - cancer  Diabetes He presents for his follow-up diabetic visit. He has type 2 diabetes mellitus. Pertinent negatives for hypoglycemia include no dizziness or headaches. Pertinent negatives for diabetes include no fatigue, no foot ulcerations, no polydipsia, no polyphagia and no polyuria. There are no hypoglycemic complications. There are no diabetic complications. Risk factors for coronary artery disease include male sex and sedentary lifestyle. He is compliant with treatment most of the time (he has been having to try to space out his medications due to not  being able to get the pen needles and medications due to insurance). When asked about meal planning, he reported none. He rarely participates in exercise. An ACE inhibitor/angiotensin II receptor blocker is not being taken. He does not see a podiatrist.Eye exam is not current.     Past Medical History:  Diagnosis Date  . Diabetes mellitus without complication ([place])    Type 2     History reviewed. No pertinent family history.   Current Outpatient Medications:  .  Insulin Degludec (TRESIBA [place]), Inject into the skin., Disp: , Rfl:  .  SitaGLIPtin-MetFORMIN HCl 50-1000 MG TB24, Take 1 tablet by mouth daily., Disp: 90 tablet, Rfl: 1   No Known Allergies   Review of Systems  Constitutional: Negative.  Negative for fatigue.  HENT: Negative.   Respiratory: Negative.   Cardiovascular: Negative.   Endocrine: Negative for polydipsia, polyphagia and polyuria.  Musculoskeletal: Negative.   Skin: Negative.   Neurological: Negative for dizziness and headaches.  Psychiatric/Behavioral: Negative.      Today's Vitals   09/03/20 0858  BP: 122/84  Pulse: (!) 107  Temp: 98.3 F (36.8 C)  TempSrc: Oral  Weight: 170 lb (77.1 kg)  Height: $Remove'5\' 8"'CaIFFNR$  (1.727 m)   Body mass index is 25.85 kg/m.   Objective:  Physical Exam Constitutional:      General: He is not in acute distress.    Appearance: Normal appearance.  Cardiovascular:     Rate and Rhythm: Normal rate and regular rhythm.     Pulses: Normal pulses.  Heart sounds: Normal heart sounds. No murmur heard.   Pulmonary:     Effort: Pulmonary effort is normal. No respiratory distress.     Breath sounds: Normal breath sounds. No wheezing.  Skin:    General: Skin is warm.     Capillary Refill: Capillary refill takes less than 2 seconds.  Neurological:     General: No focal deficit present.     Mental Status: He is alert and oriented to person, place, and time.     Cranial Nerves: No cranial nerve deficit.     Motor: No weakness.   Psychiatric:        Mood and Affect: Mood normal.        Behavior: Behavior normal.        Thought Content: Thought content normal.        Judgment: Judgment normal.         Assessment And Plan:     1. Type 2 diabetes mellitus without complication, with long-term current use of insulin (HCC)  Chronic, he has been on tresiba only recently  Will check his HgbA1c pending the results will consider weaning off insulin and adding oral medications  I will refer for his eye exam - SitaGLIPtin-MetFORMIN HCl 50-1000 MG TB24; Take 1 tablet by mouth daily.  Dispense: 90 tablet; Refill: 1 - Ambulatory referral to Ophthalmology - CMP14+EGFR - CBC - Lipid panel - Hemoglobin A1c - POCT Urinalysis Dipstick (81002) - POCT UA - Microalbumin  2. Establishing care with new doctor, encounter for     Patient was given opportunity to ask questions. Patient verbalized understanding of the plan and was able to repeat key elements of the plan. All questions were answered to their satisfaction.  Minette Brine, FNP   I, Minette Brine, FNP, have reviewed all documentation for this visit. The documentation on 09/03/20 for the exam, diagnosis, procedures, and orders are all accurate and complete.   THE PATIENT IS ENCOURAGED TO PRACTICE SOCIAL DISTANCING DUE TO THE COVID-19 PANDEMIC.

## 2020-09-04 LAB — LIPID PANEL
Chol/HDL Ratio: 3.7 ratio (ref 0.0–5.0)
Cholesterol, Total: 160 mg/dL (ref 100–199)
HDL: 43 mg/dL (ref >39)
LDL Chol Calc (NIH): 106 mg/dL — ABNORMAL HIGH (ref 0–99)
Triglycerides: 51 mg/dL (ref 0–149)
VLDL Cholesterol Cal: 11 mg/dL (ref 5–40)

## 2020-09-04 LAB — CMP14+EGFR
ALT: 9 IU/L (ref 0–44)
AST: 9 IU/L (ref 0–40)
Albumin/Globulin Ratio: 1.9 (ref 1.2–2.2)
Albumin: 4.6 g/dL (ref 4.0–5.0)
Alkaline Phosphatase: 72 IU/L (ref 44–121)
BUN/Creatinine Ratio: 9 (ref 9–20)
BUN: 8 mg/dL (ref 6–24)
Bilirubin Total: 0.5 mg/dL (ref 0.0–1.2)
CO2: 27 mmol/L (ref 20–29)
Calcium: 9.5 mg/dL (ref 8.7–10.2)
Chloride: 104 mmol/L (ref 96–106)
Creatinine, Ser: 0.86 mg/dL (ref 0.76–1.27)
GFR calc Af Amer: 125 mL/min/{1.73_m2} (ref >59)
GFR calc non Af Amer: 108 mL/min/{1.73_m2} (ref >59)
Globulin, Total: 2.4 g/dL (ref 1.5–4.5)
Glucose: 269 mg/dL — ABNORMAL HIGH (ref 65–99)
Potassium: 4.3 mmol/L (ref 3.5–5.2)
Sodium: 143 mmol/L (ref 134–144)
Total Protein: 7 g/dL (ref 6.0–8.5)

## 2020-09-04 LAB — CBC
Hematocrit: 43.8 % (ref 37.5–51.0)
Hemoglobin: 14.6 g/dL (ref 13.0–17.7)
MCH: 28.3 pg (ref 26.6–33.0)
MCHC: 33.3 g/dL (ref 31.5–35.7)
MCV: 85 fL (ref 79–97)
Platelets: 185 10*3/uL (ref 150–450)
RBC: 5.16 x10E6/uL (ref 4.14–5.80)
RDW: 13.4 % (ref 11.6–15.4)
WBC: 3.5 10*3/uL (ref 3.4–10.8)

## 2020-09-04 LAB — HEMOGLOBIN A1C
Est. average glucose Bld gHb Est-mCnc: 189 mg/dL
Hgb A1c MFr Bld: 8.2 % — ABNORMAL HIGH (ref 4.8–5.6)

## 2020-09-08 ENCOUNTER — Other Ambulatory Visit: Payer: Self-pay

## 2020-09-08 NOTE — Telephone Encounter (Signed)
Prior auth done for janumet waiting on response from the [employer].,

## 2020-09-22 ENCOUNTER — Other Ambulatory Visit: Payer: Self-pay | Admitting: Nurse Practitioner

## 2020-09-22 DIAGNOSIS — E119 Type 2 diabetes mellitus without complications: Secondary | ICD-10-CM

## 2020-09-22 MED ORDER — XIGDUO XR 2.5-1000 MG PO TB24: 2.0000 | ORAL_TABLET | Freq: Every day | ORAL | 3 refills | Status: DC

## 2020-09-22 MED ORDER — OZEMPIC (0.25 OR 0.5 MG/DOSE) 2 MG/1.5ML ~~LOC~~ SOPN: 0.2500 mg | PEN_INJECTOR | SUBCUTANEOUS | 0 refills | Status: DC

## 2020-10-23 DIAGNOSIS — H1045 Other chronic allergic conjunctivitis: Secondary | ICD-10-CM | POA: Diagnosis not present

## 2020-10-23 DIAGNOSIS — H3581 Retinal edema: Secondary | ICD-10-CM | POA: Diagnosis not present

## 2020-10-23 DIAGNOSIS — E113293 Type 2 diabetes mellitus with mild nonproliferative diabetic retinopathy without macular edema, bilateral: Secondary | ICD-10-CM | POA: Diagnosis not present

## 2020-10-23 DIAGNOSIS — H527 Unspecified disorder of refraction: Secondary | ICD-10-CM | POA: Diagnosis not present

## 2020-10-23 LAB — HM DIABETES EYE EXAM

## 2020-10-26 ENCOUNTER — Encounter: Payer: Self-pay | Admitting: Nurse Practitioner

## 2020-10-29 ENCOUNTER — Other Ambulatory Visit: Payer: Self-pay

## 2020-10-29 ENCOUNTER — Ambulatory Visit: Payer: BC Managed Care – PPO | Admitting: Nurse Practitioner

## 2020-10-29 ENCOUNTER — Encounter: Payer: Self-pay | Admitting: Nurse Practitioner

## 2020-10-29 VITALS — BP 122/80 | HR 98 | Temp 98.1°F | Ht 68.8 in | Wt 161.6 lb

## 2020-10-29 DIAGNOSIS — Z794 Long term (current) use of insulin: Secondary | ICD-10-CM | POA: Diagnosis not present

## 2020-10-29 DIAGNOSIS — G4700 Insomnia, unspecified: Secondary | ICD-10-CM

## 2020-10-29 DIAGNOSIS — E119 Type 2 diabetes mellitus without complications: Secondary | ICD-10-CM | POA: Diagnosis not present

## 2020-10-29 DIAGNOSIS — M79674 Pain in right toe(s): Secondary | ICD-10-CM

## 2020-10-29 LAB — HEMOGLOBIN A1C
Est. average glucose Bld gHb Est-mCnc: 126 mg/dL
Hgb A1c MFr Bld: 6 % — ABNORMAL HIGH (ref 4.8–5.6)

## 2020-10-29 MED ORDER — INSULIN PEN NEEDLE 32G X 6 MM MISC: 1.0000 | 2 refills | Status: DC | PRN

## 2020-10-29 MED ORDER — OZEMPIC (0.25 OR 0.5 MG/DOSE) 2 MG/1.5ML ~~LOC~~ SOPN: 0.2500 mg | PEN_INJECTOR | SUBCUTANEOUS | 1 refills | Status: DC

## 2020-10-29 MED ORDER — XIGDUO XR 5-1000 MG PO TB24: 1.0000 | ORAL_TABLET | Freq: Every day | ORAL | 1 refills | Status: DC

## 2020-10-29 NOTE — Progress Notes (Signed)
This visit occurred during the SARS-CoV-2 public health emergency.  Safety protocols were in place, including screening questions prior to the visit, additional usage of staff PPE, and extensive cleaning of exam room while observing appropriate contact time as indicated for disinfecting solutions.  Subjective:     Patient ID: Tim Tate , male    DOB: 08-Apr-1981 , 40 y.o.   MRN: [id number]   Chief Complaint  Patient presents with  . Diabetes    HPI  Tim Tate presents for a diabetes follow-up.  He reports compliance with Ozempic and Xigduo.  He ran out of samples of Xigduo.  He denies any side effects from medications besides flatuence.  His fasting glucose today was 111.  He saw ophthalmology on 10/23/20 and was told he did not have diabetic retinopathy.  He does have cotton spots OU and has a follow-up in 2 months.    He reports insomnia for many years.  He thinks that it stems from going to school in the mornings, working in the evenings, and staying up late to study.  He can fall asleep easily, but only sleeps for 1-2 hours at a time.  He has tried melatonin and sleeping with white noise in the background.  He denies feeling fatigued upon awakening.   Tim Tate declines a Covid booster.  He is unsure of his last tetanus vaccine.    Wt Readings from Last 3 Encounters: 10/29/20 : 161 lb 9.6 oz (73.3 kg) 09/03/20 : 170 lb (77.1 kg) 07/23/18 : 206 lb (93.4 kg)   Diabetes He presents for his follow-up diabetic visit. He has type 2 diabetes mellitus. There are no hypoglycemic associated symptoms. Pertinent negatives for hypoglycemia include no dizziness or headaches. There are no diabetic associated symptoms. Pertinent negatives for diabetes include no chest pain, no fatigue, no polydipsia, no polyphagia and no polyuria. There are no hypoglycemic complications. Risk factors for coronary artery disease include sedentary lifestyle and male sex. Current diabetic treatment includes  oral agent (triple therapy). He has not had a previous visit with a dietitian. He rarely participates in exercise. (Blood sugar this morning is 111)     Past Medical History:  Diagnosis Date  . Diabetes mellitus without complication ([place])    Type 2     History reviewed. No pertinent family history.   Current Outpatient Medications:  .  Dapagliflozin-metFORMIN HCl ER (XIGDUO XR) 11-998 MG TB24, Take 1 tablet by mouth daily., Disp: 90 tablet, Rfl: 1 .  Insulin Pen Needle 32G X 6 MM MISC, 1 each by Does not apply route as needed., Disp: 100 each, Rfl: 2 .  Semaglutide,0.25 or 0.5MG /DOS, (OZEMPIC, 0.25 OR 0.5 MG/DOSE,) 2 MG/1.5ML SOPN, Inject 0.25 mg into the skin once a week., Disp: 4.5 mL, Rfl: 1   No Known Allergies   Review of Systems  Constitutional: Negative.  Negative for fatigue.       Insomnia   Respiratory: Negative.  Negative for shortness of breath.   Cardiovascular: Negative.  Negative for chest pain, palpitations and leg swelling.  Endocrine: Negative for polydipsia, polyphagia and polyuria.  Neurological: Negative for dizziness and headaches.  Psychiatric/Behavioral: Positive for sleep disturbance (insomnia).     Today's Vitals   10/29/20 0834  BP: 122/80  Pulse: 98  Temp: 98.1 F (36.7 C)  TempSrc: Oral  Weight: 161 lb 9.6 oz (73.3 kg)  Height: 5' 8.8" (1.748 m)  PainSc: 0-No pain   Body mass index is 24 kg/m.   Objective:  Physical Exam Vitals reviewed.  Constitutional:      General: He is not in acute distress.    Appearance: Normal appearance.  Cardiovascular:     Rate and Rhythm: Normal rate and regular rhythm.     Pulses: Normal pulses.     Heart sounds: Normal heart sounds. No murmur heard.   Pulmonary:     Effort: Pulmonary effort is normal. No respiratory distress.     Breath sounds: Normal breath sounds. No wheezing.  Skin:    Capillary Refill: Capillary refill takes less than 2 seconds.  Neurological:     General: No focal deficit  present.     Mental Status: He is alert and oriented to person, place, and time.     Cranial Nerves: No cranial nerve deficit.  Psychiatric:        Mood and Affect: Mood normal.        Behavior: Behavior normal.        Thought Content: Thought content normal.        Judgment: Judgment normal.         Assessment And Plan:     1. Type 2 diabetes mellitus without complication, with long-term current use of insulin (HCC)  Chronic, he reports he is feeling better since taking ozempic 0.25mg  weekly.   He is tolerating medications well, he has had approximately 9 lbs since his last visit, I will keep him on the low dose of Ozempic.  Diabetic foot exam done with normal findings - Dapagliflozin-metFORMIN HCl ER (XIGDUO XR) 11-998 MG TB24; Take 1 tablet by mouth daily.  Dispense: 90 tablet; Refill: 1 - Semaglutide,0.25 or 0.5MG /DOS, (OZEMPIC, 0.25 OR 0.5 MG/DOSE,) 2 MG/1.5ML SOPN; Inject 0.25 mg into the skin once a week.  Dispense: 4.5 mL; Refill: 1 - Hemoglobin A1c - Insulin Pen Needle 32G X 6 MM MISC; 1 each by Does not apply route as needed.  Dispense: 100 each; Refill: 2  2. Insomnia, unspecified type  He is encouraged to take magnesium 250 mg with evening meal or to take Calm supplement as needed   3. Pain of toe of right foot  Intermittent toe pain, no abnormal findings on physical exam   Patient was given opportunity to ask questions. Patient verbalized understanding of the plan and was able to repeat key elements of the plan. All questions were answered to their satisfaction.  Minette Brine, FNP   I, Minette Brine, FNP, have reviewed all documentation for this visit. The documentation on 10/29/20 for the exam, diagnosis, procedures, and orders are all accurate and complete.   I have reviewed this encounter including the documentation in this note and/or discussed this patient with the provider. I am certifying that I agree with the content of this note as the primary care nurse  practitioner.  Minette Brine, DNP, FNP-BC  IF YOU HAVE BEEN REFERRED TO A SPECIALIST, IT MAY TAKE 1-2 WEEKS TO SCHEDULE/PROCESS THE REFERRAL. IF YOU HAVE NOT HEARD FROM US/SPECIALIST IN TWO WEEKS, PLEASE GIVE [place] A CALL AT [phone].   THE PATIENT IS ENCOURAGED TO PRACTICE SOCIAL DISTANCING DUE TO THE COVID-19 PANDEMIC.

## 2020-10-29 NOTE — Patient Instructions (Addendum)
Diabetes Mellitus Basics  Diabetes mellitus, or diabetes, is a long-term (chronic) disease. It occurs when the body does not properly use sugar (glucose) that is released from food after you eat. Diabetes mellitus may be caused by one or both of these problems:  Your pancreas does not make enough of a hormone called insulin.  Your body does not react in a normal way to the insulin that it makes. Insulin lets glucose enter cells in your body. This gives you energy. If you have diabetes, glucose cannot get into cells. This causes high blood glucose (hyperglycemia). How to treat and manage diabetes You may need to take insulin or other diabetes medicines daily to keep your glucose in balance. If you are prescribed insulin, you will learn how to give yourself insulin by injection. You may need to adjust the amount of insulin you take based on the foods that you eat. You will need to check your blood glucose levels using a glucose monitor as told by your health care provider. The readings can help determine if you have low or high blood glucose. Generally, you should have these blood glucose levels:  Before meals (preprandial): 80-130 mg/dL (4.4-7.2 mmol/L).  After meals (postprandial): below 180 mg/dL (10 mmol/L).  Hemoglobin A1c (HbA1c) level: less than 7%. Your health care provider will set treatment goals for you. Keep all follow-up visits. This is important. Follow these instructions at home: Diabetes medicines Take your diabetes medicines every day as told by your health care provider. List your diabetes medicines here:  Name of medicine: ______________________________ ? Amount (dose): _______________ Time (a.m./p.m.): _______________ Notes: ___________________________________  Name of medicine: ______________________________ ? Amount (dose): _______________ Time (a.m./p.m.): _______________ Notes: ___________________________________  Name of medicine:  ______________________________ ? Amount (dose): _______________ Time (a.m./p.m.): _______________ Notes: ___________________________________ Insulin If you use insulin, list the types of insulin you use here:  Insulin type: ______________________________ ? Amount (dose): _______________ Time (a.m./p.m.): _______________Notes: ___________________________________  Insulin type: ______________________________ ? Amount (dose): _______________ Time (a.m./p.m.): _______________ Notes: ___________________________________  Insulin type: ______________________________ ? Amount (dose): _______________ Time (a.m./p.m.): _______________ Notes: ___________________________________  Insulin type: ______________________________ ? Amount (dose): _______________ Time (a.m./p.m.): _______________ Notes: ___________________________________  Insulin type: ______________________________ ? Amount (dose): _______________ Time (a.m./p.m.): _______________ Notes: ___________________________________ Managing blood glucose Check your blood glucose levels using a glucose monitor as told by your health care provider. Write down the times that you check your glucose levels here:  Time: _______________ Notes: ___________________________________  Time: _______________ Notes: ___________________________________  Time: _______________ Notes: ___________________________________  Time: _______________ Notes: ___________________________________  Time: _______________ Notes: ___________________________________  Time: _______________ Notes: ___________________________________   Low blood glucose Low blood glucose (hypoglycemia) is when glucose is at or below 70 mg/dL (3.9 mmol/L). Symptoms may include:  Feeling: ? Hungry. ? Sweaty and clammy. ? Irritable or easily upset. ? Dizzy. ? Sleepy.  Having: ? A fast heartbeat. ? A headache. ? A change in your vision. ? Numbness around the mouth, lips, or  tongue.  Having trouble with: ? Moving (coordination). ? Sleeping. Treating low blood glucose To treat low blood glucose, eat or drink something containing sugar right away. If you can think clearly and swallow safely, follow the 15:15 rule:  Take 15 grams of a fast-acting carb (carbohydrate), as told by your health care provider.  Some fast-acting carbs are: ? Glucose tablets: take 3-4 tablets. ? Hard candy: eat 3-5 pieces. ? Fruit juice: drink 4 oz (120 mL). ? Regular (not diet) soda: drink 4-6 oz (120-180 mL). ? Honey or sugar:   eat 1 Tbsp (15 mL).  Check your blood glucose levels 15 minutes after you take the carb.  If your glucose is still at or below 70 mg/dL (3.9 mmol/L), take 15 grams of a carb again.  If your glucose does not go above 70 mg/dL (3.9 mmol/L) after 3 tries, get help right away.  After your glucose goes back to normal, eat a meal or a snack within 1 hour. Treating very low blood glucose If your glucose is at or below 54 mg/dL (3 mmol/L), you have very low blood glucose (severe hypoglycemia). This is an emergency. Do not wait to see if the symptoms will go away. Get medical help right away. Call your local emergency services (911 in the [place] Do not drive yourself to the hospital. Questions to ask your health care provider  Should I talk with a diabetes educator?  What equipment will I need to care for myself at home?  What diabetes medicines do I need? When should I take them?  How often do I need to check my blood glucose levels?  What number can I call if I have questions?  When is my follow-up visit?  Where can I find a support group for people with diabetes? Where to find more information  American Diabetes Association: www.diabetes.org  Association of Diabetes Care and Education Specialists: www.diabeteseducator.org Contact a health care provider if:  Your blood glucose is at or above 240 mg/dL (13.3 mmol/L) for 2 days in a row.  You have  been sick or have had a fever for 2 days or more, and you are not getting better.  You have any of these problems for more than 6 hours: ? You cannot eat or drink. ? You feel nauseous. ? You vomit. ? You have diarrhea. Get help right away if:  Your blood glucose is lower than 54 mg/dL (3 mmol/L).  You get confused.  You have trouble thinking clearly.  You have trouble breathing. These symptoms may represent a serious problem that is an emergency. Do not wait to see if the symptoms will go away. Get medical help right away. Call your local emergency services (911 in the [place] Do not drive yourself to the hospital. Summary  Diabetes mellitus is a chronic disease that occurs when the body does not properly use sugar (glucose) that is released from food after you eat.  Take insulin and diabetes medicines as told.  Check your blood glucose every day, as often as told.  Keep all follow-up visits. This is important. This information is not intended to replace advice given to you by your health care provider. Make sure you discuss any questions you have with your health care provider. Document Revised: 11/19/2019 Document Reviewed: 11/19/2019 Elsevier Patient Education  [place] over the counter to help with sleep. If not better by next visit may need to do a sleep aid. Also can take magnesium 250 mg with evening meal if you do not get the Calm

## 2020-10-29 NOTE — Progress Notes (Deleted)
I,Yamilka Roman [employer] as a Education administrator for [employer], FNP.,have documented all relevant documentation on the behalf of Minette Brine, FNP,as directed by  Minette Brine, FNP while in the presence of Minette Brine, [place]. This visit occurred during the SARS-CoV-2 public health emergency.  Safety protocols were in place, including screening questions prior to the visit, additional usage of staff PPE, and extensive cleaning of exam room while observing appropriate contact time as indicated for disinfecting solutions.  Subjective:     Patient ID: Tim Tate , male    DOB: 09-Mar-1981 , 40 y.o.   MRN: [id number]   Chief Complaint  Patient presents with  . Diabetes    HPI  Patient presents today for a diabetes f/u.  He is taking his Ozempic, he is on 0.25 mg weekly. He is no longer taking the insulin.    Wt Readings from Last 3 Encounters: 10/29/20 : 161 lb 9.6 oz (73.3 kg) 09/03/20 : 170 lb (77.1 kg) 07/23/18 : 206 lb (93.4 kg)   Diabetes He presents for his follow-up diabetic visit. He has type 2 diabetes mellitus. Pertinent negatives for hypoglycemia include no dizziness or headaches. Pertinent negatives for diabetes include no fatigue, no foot ulcerations, no polydipsia, no polyphagia and no polyuria. There are no hypoglycemic complications. There are no diabetic complications. Risk factors for coronary artery disease include male sex and sedentary lifestyle. He is compliant with treatment most of the time (he has been having to try to space out his medications due to not being able to get the pen needles and medications due to insurance). When asked about meal planning, he reported none. He rarely participates in exercise. (Blood sugar 76-246 (outlier).  ) An ACE inhibitor/angiotensin II receptor blocker is not being taken. He does not see a podiatrist.Eye exam is not current.  Insomnia Primary symptoms: sleep disturbance.  The current episode started more than one year. The problem  occurs nightly.     Past Medical History:  Diagnosis Date  . Diabetes mellitus without complication ([place])    Type 2     No family history on file.   Current Outpatient Medications:  .  Dapagliflozin-metFORMIN HCl ER (XIGDUO XR) 2.11-998 MG TB24, Take 2 tablets by mouth daily., Disp: 30 tablet, Rfl: 3 .  Semaglutide,0.25 or 0.5MG /DOS, (OZEMPIC, 0.25 OR 0.5 MG/DOSE,) 2 MG/1.5ML SOPN, Inject 0.25 mg into the skin once a week., Disp: 4.5 mL, Rfl: 0   No Known Allergies   Review of Systems  Constitutional: Negative for fatigue.  Endocrine: Negative for polydipsia, polyphagia and polyuria.  Neurological: Negative for dizziness and headaches.  Psychiatric/Behavioral: Positive for sleep disturbance. The patient has insomnia.      Today's Vitals   10/29/20 0834  BP: 122/80  Pulse: 98  Temp: 98.1 F (36.7 C)  TempSrc: Oral  Weight: 161 lb 9.6 oz (73.3 kg)  Height: 5' 8.8" (1.748 m)  PainSc: 0-No pain   Body mass index is 24 kg/m.   Objective:  Physical Exam      Assessment And Plan:     1. Type 2 diabetes mellitus without complication, with long-term current use of insulin ([place])     Patient was given opportunity to ask questions. Patient verbalized understanding of the plan and was able to repeat key elements of the plan. All questions were answered to their satisfaction.  Minette Brine, FNP   I, Minette Brine, FNP, have reviewed all documentation for this visit. The documentation on 10/29/20 for the exam, diagnosis,  procedures, and orders are all accurate and complete.   IF YOU HAVE BEEN REFERRED TO A SPECIALIST, IT MAY TAKE 1-2 WEEKS TO SCHEDULE/PROCESS THE REFERRAL. IF YOU HAVE NOT HEARD FROM US/SPECIALIST IN TWO WEEKS, PLEASE GIVE [place] A CALL AT [phone].   THE PATIENT IS ENCOURAGED TO PRACTICE SOCIAL DISTANCING DUE TO THE COVID-19 PANDEMIC.

## 2020-10-30 ENCOUNTER — Telehealth: Payer: Self-pay

## 2020-10-30 ENCOUNTER — Other Ambulatory Visit: Payer: Self-pay

## 2020-10-30 DIAGNOSIS — E119 Type 2 diabetes mellitus without complications: Secondary | ICD-10-CM

## 2020-10-30 DIAGNOSIS — Z794 Long term (current) use of insulin: Secondary | ICD-10-CM

## 2020-10-30 MED ORDER — INSULIN PEN NEEDLE 32G X 6 MM MISC: 1.0000 | 2 refills | Status: AC

## 2020-10-30 NOTE — Telephone Encounter (Signed)
Patient notified that PA for ozempic has been approved [id number]

## 2020-12-22 DIAGNOSIS — H1045 Other chronic allergic conjunctivitis: Secondary | ICD-10-CM | POA: Diagnosis not present

## 2020-12-22 DIAGNOSIS — H3581 Retinal edema: Secondary | ICD-10-CM | POA: Diagnosis not present

## 2020-12-22 DIAGNOSIS — E113293 Type 2 diabetes mellitus with mild nonproliferative diabetic retinopathy without macular edema, bilateral: Secondary | ICD-10-CM | POA: Diagnosis not present

## 2020-12-22 LAB — HM DIABETES EYE EXAM

## 2021-01-11 ENCOUNTER — Ambulatory Visit (INDEPENDENT_AMBULATORY_CARE_PROVIDER_SITE_OTHER): Payer: BC Managed Care – PPO | Admitting: Podiatry

## 2021-01-11 ENCOUNTER — Other Ambulatory Visit: Payer: Self-pay

## 2021-01-11 DIAGNOSIS — E0843 Diabetes mellitus due to underlying condition with diabetic autonomic (poly)neuropathy: Secondary | ICD-10-CM

## 2021-01-11 DIAGNOSIS — B353 Tinea pedis: Secondary | ICD-10-CM | POA: Diagnosis not present

## 2021-01-11 MED ORDER — CLOTRIMAZOLE-BETAMETHASONE 1-0.05 % EX CREA: 1.0000 "application " | TOPICAL_CREAM | Freq: Two times a day (BID) | CUTANEOUS | 1 refills | Status: DC

## 2021-01-18 NOTE — Progress Notes (Signed)
   HPI: 40 y.o. male presenting today PMHx diabetes mellitus as a referral from his PCP for diabetic foot exam.  Patient also states that he has some concerns with on and off itching to the bilateral feet.  He presents for further treatment and evaluation.  He states that his last A1c was 6.0 on 10/29/2020  Past Medical History:  Diagnosis Date   Diabetes mellitus without complication (HCC)    Type 2     Physical Exam: General: The patient is alert and oriented x3 in no acute distress.  Dermatology: Skin is warm, dry and supple bilateral lower extremities. Negative for open lesions or macerations.  Vascular: Palpable pedal pulses bilaterally. No edema or erythema noted. Capillary refill within normal limits.  Neurological: Epicritic and protective threshold grossly intact bilaterally.   Musculoskeletal Exam: No pedal deformities noted.   Assessment: 1.  Diabetes mellitus; uncomplicated 2.  Intermittent tinea pedis bilateral   Plan of Care:  1. Patient evaluated. 2.  Prescription for Lotrisone cream to apply as needed 3.  Recommend good foot hygiene and keeping the feet dry 4.  Return to clinic as needed      Edrick Kins, DPM [facility]  Dr. Edrick Kins, DPM    [address], [place] [address]                Office [phone]  Fax [phone]

## 2021-02-09 ENCOUNTER — Ambulatory Visit (INDEPENDENT_AMBULATORY_CARE_PROVIDER_SITE_OTHER): Payer: BC Managed Care – PPO | Admitting: Nurse Practitioner

## 2021-02-09 ENCOUNTER — Other Ambulatory Visit: Payer: Self-pay

## 2021-02-09 ENCOUNTER — Encounter: Payer: Self-pay | Admitting: Nurse Practitioner

## 2021-02-09 VITALS — BP 124/80 | HR 102 | Temp 98.2°F | Ht 68.8 in | Wt 166.2 lb

## 2021-02-09 DIAGNOSIS — Z79899 Other long term (current) drug therapy: Secondary | ICD-10-CM | POA: Diagnosis not present

## 2021-02-09 DIAGNOSIS — Z794 Long term (current) use of insulin: Secondary | ICD-10-CM | POA: Diagnosis not present

## 2021-02-09 DIAGNOSIS — Z0000 Encounter for general adult medical examination without abnormal findings: Secondary | ICD-10-CM | POA: Diagnosis not present

## 2021-02-09 DIAGNOSIS — Z125 Encounter for screening for malignant neoplasm of prostate: Secondary | ICD-10-CM

## 2021-02-09 DIAGNOSIS — E119 Type 2 diabetes mellitus without complications: Secondary | ICD-10-CM | POA: Diagnosis not present

## 2021-02-09 DIAGNOSIS — E559 Vitamin D deficiency, unspecified: Secondary | ICD-10-CM | POA: Diagnosis not present

## 2021-02-09 NOTE — Progress Notes (Signed)
I,Yamilka Roman [employer] as a Neurosurgeon for [employer], FNP.,have documented all relevant documentation on the behalf of [name], FNP,as directed by  [name], FNP while in the presence of [name], FNP.   This visit occurred during the SARS-CoV-2 public health emergency.  Safety protocols were in place, including screening questions prior to the visit, additional usage of staff PPE, and extensive cleaning of exam room while observing appropriate contact time as indicated for disinfecting solutions.  Subjective:     Patient ID: Tim Tate , male    DOB: [birth year] , 40 y.o.   MRN: [id number]   Chief Complaint  Patient presents with   Annual Exam    HPI  Patient here for hm  Wt Readings from Last 3 Encounters: 02/09/21 : 166 lb 3.2 oz (75.4 kg) 10/29/20 : 161 lb 9.6 oz (73.3 kg) 09/03/20 : 170 lb (77.1 kg)    Diabetes He presents for his follow-up diabetic visit. He has type 2 diabetes mellitus. There are no hypoglycemic associated symptoms. Pertinent negatives for hypoglycemia include no dizziness or headaches. There are no diabetic associated symptoms. Pertinent negatives for diabetes include no chest pain, no fatigue, no polydipsia, no polyphagia and no polyuria. There are no hypoglycemic complications. Risk factors for coronary artery disease include sedentary lifestyle and male sex. Current diabetic treatment includes oral agent (triple therapy). He has not had a previous visit with a dietitian. He rarely participates in exercise. (Blood sugar this morning is 111)    Past Medical History:  Diagnosis Date   Diabetes mellitus without complication (HCC)    Type 2     History reviewed. No pertinent family history.   Current Outpatient Medications:    clotrimazole-betamethasone (LOTRISONE) cream, Apply 1 application topically 2 (two) times daily., Disp: 45 g, Rfl: 1   Insulin Pen Needle 32G X 6 MM MISC, 1 each by Does not apply route once a week., Disp:  100 each, Rfl: 2   Semaglutide,0.25 or 0.5MG /DOS, (OZEMPIC, 0.25 OR 0.5 MG/DOSE,) 2 MG/1.5ML SOPN, Inject 0.25 mg into the skin once a week., Disp: 4.5 mL, Rfl: 1   No Known Allergies   Men's preventive visit. Patient Health Questionnaire (PHQ-2) is  Flowsheet Row Office Visit from 09/03/2020 in [facility]  PHQ-2 Total Score 0      Patient is on a Regular diet. Exercising 3 times a week. Marital status: Single. Relevant history for alcohol use is:  Social History   Substance and Sexual Activity  Alcohol Use No  . Relevant history for tobacco use is:  Social History   Tobacco Use  Smoking Status Never  Smokeless Tobacco Never  .   Review of Systems  Constitutional:  Negative for fatigue.  HENT: Negative.    Eyes: Negative.   Respiratory: Negative.    Cardiovascular: Negative.  Negative for chest pain, palpitations and leg swelling.  Gastrointestinal: Negative.   Endocrine: Negative.  Negative for polydipsia, polyphagia and polyuria.  Genitourinary: Negative.   Musculoskeletal: Negative.   Allergic/Immunologic: Negative.   Neurological: Negative.  Negative for dizziness and headaches.  Hematological: Negative.   Psychiatric/Behavioral: Negative.      Today's Vitals   02/09/21 1022  BP: 124/80  Pulse: (!) 102  Temp: 98.2 F (36.8 C)  Weight: 166 lb 3.2 oz (75.4 kg)  Height: 5' 8.8" (1.748 m)  PainSc: 0-No pain   Body mass index is 24.69 kg/m.   Objective:  Physical Exam Vitals reviewed.  Constitutional:  General: He is not in acute distress.    Appearance: Normal appearance.  HENT:     Head: Normocephalic and atraumatic.     Right Ear: Tympanic membrane, ear canal and external ear normal. There is no impacted cerumen.     Left Ear: Tympanic membrane, ear canal and external ear normal. There is no impacted cerumen.     Nose:     Comments: Deferred - masked    Mouth/Throat:     Comments: Deferred - masked Cardiovascular:     Rate  and Rhythm: Normal rate and regular rhythm.     Pulses: Normal pulses.     Heart sounds: Normal heart sounds. No murmur heard. Pulmonary:     Effort: Pulmonary effort is normal. No respiratory distress.     Breath sounds: Normal breath sounds. No wheezing.  Abdominal:     General: Abdomen is flat. Bowel sounds are normal. There is no distension.     Palpations: Abdomen is soft.     Tenderness: There is no abdominal tenderness.  Genitourinary:    Prostate: Normal.     Rectum: Guaiac result negative.  Musculoskeletal:        General: No swelling or tenderness. Normal range of motion.     Cervical back: Normal range of motion and neck supple.  Skin:    General: Skin is warm.     Capillary Refill: Capillary refill takes less than 2 seconds.  Neurological:     General: No focal deficit present.     Mental Status: He is alert and oriented to person, place, and time.     Cranial Nerves: No cranial nerve deficit.     Motor: No weakness.  Psychiatric:        Mood and Affect: Mood normal.        Behavior: Behavior normal.        Thought Content: Thought content normal.        Judgment: Judgment normal.        Assessment And Plan:    1. Encounter for general adult medical examination w/o abnormal findings Behavior modifications discussed and diet history reviewed.   Pt will continue to exercise regularly and modify diet with low GI, plant based foods and decrease intake of processed foods.  Recommend intake of daily multivitamin, Vitamin D, and calcium.  Recommend for preventive screenings, as well as recommend immunizations that include influenza, TDAP,  2. Type 2 diabetes mellitus without complication, without long-term current use of insulin (HCC) Chronic, better controlled Continue with current medications, no longer on insulin Encouraged to limit intake of sugary foods and drinks Encouraged to increase physical activity to 150 minutes per week Eye exam is up to date -  CMP14+EGFR - Lipid panel - Hemoglobin A1c  3. Vitamin D deficiency Will check vitamin D level and supplement as needed.    Also encouraged to spend 15 minutes in the sun daily.   4. Encounter for prostate cancer screening - PSA  5. Other long term (current) drug therapy - CBC no Diff    Patient was given opportunity to ask questions. Patient verbalized understanding of the plan and was able to repeat key elements of the plan. All questions were answered to their satisfaction.   Minette Brine, FNP   I, Minette Brine, FNP, have reviewed all documentation for this visit. The documentation on 02/09/21 for the exam, diagnosis, procedures, and orders are all accurate and complete.   THE PATIENT IS ENCOURAGED TO PRACTICE SOCIAL DISTANCING  DUE TO THE COVID-19 PANDEMIC.

## 2021-02-09 NOTE — Patient Instructions (Signed)
Health Maintenance, Male Adopting a healthy lifestyle and getting preventive care are important in promoting health and wellness. Ask your health care provider about: The right schedule for you to have regular tests and exams. Things you can do on your own to prevent diseases and keep yourself healthy. What should I know about diet, weight, and exercise? Eat a healthy diet  Eat a diet that includes plenty of vegetables, fruits, low-fat dairy products, and lean protein. Do not eat a lot of foods that are high in solid fats, added sugars, or sodium.  Maintain a healthy weight Body mass index (BMI) is a measurement that can be used to identify possible weight problems. It estimates body fat based on height and weight. Your health care provider can help determine your BMI and help you achieve or maintain ahealthy weight. Get regular exercise Get regular exercise. This is one of the most important things you can do for your health. Most adults should: Exercise for at least 150 minutes each week. The exercise should increase your heart rate and make you sweat (moderate-intensity exercise). Do strengthening exercises at least twice a week. This is in addition to the moderate-intensity exercise. Spend less time sitting. Even light physical activity can be beneficial. Watch cholesterol and blood lipids Have your blood tested for lipids and cholesterol at 41 years of age, then havethis test every 5 years. You may need to have your cholesterol levels checked more often if: Your lipid or cholesterol levels are high. You are older than 40 years of age. You are at high risk for heart disease. What should I know about cancer screening? Many types of cancers can be detected early and may often be prevented. Depending on your health history and family history, you may need to have cancer screening at various ages. This may include screening for: Colorectal cancer. Prostate cancer. Skin cancer. Lung  cancer. What should I know about heart disease, diabetes, and high blood pressure? Blood pressure and heart disease High blood pressure causes heart disease and increases the risk of stroke. This is more likely to develop in people who have high blood pressure readings, are of African descent, or are overweight. Talk with your health care provider about your target blood pressure readings. Have your blood pressure checked: Every 3-5 years if you are 18-39 years of age. Every year if you are 40 years old or older. If you are between the ages of 65 and 75 and are a current or former smoker, ask your health care provider if you should have a one-time screening for abdominal aortic aneurysm (AAA). Diabetes Have regular diabetes screenings. This checks your fasting blood sugar level. Have the screening done: Once every three years after age 45 if you are at a normal weight and have a low risk for diabetes. More often and at a younger age if you are overweight or have a high risk for diabetes. What should I know about preventing infection? Hepatitis B If you have a higher risk for hepatitis B, you should be screened for this virus. Talk with your health care provider to find out if you are at risk forhepatitis B infection. Hepatitis C Blood testing is recommended for: Everyone born from 1945 through 1965. Anyone with known risk factors for hepatitis C. Sexually transmitted infections (STIs) You should be screened each year for STIs, including gonorrhea and chlamydia, if: You are sexually active and are younger than 41 years of age. You are older than 41 years of age   and your health care provider tells you that you are at risk for this type of infection. Your sexual activity has changed since you were last screened, and you are at increased risk for chlamydia or gonorrhea. Ask your health care provider if you are at risk. Ask your health care provider about whether you are at high risk for HIV.  Your health care provider may recommend a prescription medicine to help prevent HIV infection. If you choose to take medicine to prevent HIV, you should first get tested for HIV. You should then be tested every 3 months for as long as you are taking the medicine. Follow these instructions at home: Lifestyle Do not use any products that contain nicotine or tobacco, such as cigarettes, e-cigarettes, and chewing tobacco. If you need help quitting, ask your health care provider. Do not use street drugs. Do not share needles. Ask your health care provider for help if you need support or information about quitting drugs. Alcohol use Do not drink alcohol if your health care provider tells you not to drink. If you drink alcohol: Limit how much you have to 0-2 drinks a day. Be aware of how much alcohol is in your drink. In the [place] one drink equals one 12 oz bottle of beer (355 mL), one 5 oz glass of wine (148 mL), or one 1 oz glass of hard liquor (44 mL). General instructions Schedule regular health, dental, and eye exams. Stay current with your vaccines. Tell your health care provider if: You often feel depressed. You have ever been abused or do not feel safe at home. Summary Adopting a healthy lifestyle and getting preventive care are important in promoting health and wellness. Follow your health care provider's instructions about healthy diet, exercising, and getting tested or screened for diseases. Follow your health care provider's instructions on monitoring your cholesterol and blood pressure. This information is not intended to replace advice given to you by your health care provider. Make sure you discuss any questions you have with your healthcare provider. Document Revised: 07/11/2018 Document Reviewed: 07/11/2018 Elsevier Patient Education  2022 Elsevier Inc.  

## 2021-02-10 LAB — CMP14+EGFR
ALT: 16 IU/L (ref 0–44)
AST: 18 IU/L (ref 0–40)
Albumin/Globulin Ratio: 1.8 (ref 1.2–2.2)
Albumin: 4.5 g/dL (ref 4.0–5.0)
Alkaline Phosphatase: 74 IU/L (ref 44–121)
BUN/Creatinine Ratio: 9 (ref 9–20)
BUN: 9 mg/dL (ref 6–24)
Bilirubin Total: 0.5 mg/dL (ref 0.0–1.2)
CO2: 26 mmol/L (ref 20–29)
Calcium: 10 mg/dL (ref 8.7–10.2)
Chloride: 103 mmol/L (ref 96–106)
Creatinine, Ser: 1.03 mg/dL (ref 0.76–1.27)
Globulin, Total: 2.5 g/dL (ref 1.5–4.5)
Glucose: 97 mg/dL (ref 65–99)
Potassium: 4.8 mmol/L (ref 3.5–5.2)
Sodium: 141 mmol/L (ref 134–144)
Total Protein: 7 g/dL (ref 6.0–8.5)
eGFR: 94 mL/min/{1.73_m2} (ref >59)

## 2021-02-10 LAB — PSA: Prostate Specific Ag, Serum: 0.9 ng/mL (ref 0.0–4.0)

## 2021-02-10 LAB — CBC
Hematocrit: 42.6 % (ref 37.5–51.0)
Hemoglobin: 14.5 g/dL (ref 13.0–17.7)
MCH: 28.6 pg (ref 26.6–33.0)
MCHC: 34 g/dL (ref 31.5–35.7)
MCV: 84 fL (ref 79–97)
Platelets: 218 10*3/uL (ref 150–450)
RBC: 5.07 x10E6/uL (ref 4.14–5.80)
RDW: 12.8 % (ref 11.6–15.4)
WBC: 3.6 10*3/uL (ref 3.4–10.8)

## 2021-02-10 LAB — LIPID PANEL
Chol/HDL Ratio: 3 ratio (ref 0.0–5.0)
Cholesterol, Total: 131 mg/dL (ref 100–199)
HDL: 43 mg/dL (ref >39)
LDL Chol Calc (NIH): 76 mg/dL (ref 0–99)
Triglycerides: 53 mg/dL (ref 0–149)
VLDL Cholesterol Cal: 12 mg/dL (ref 5–40)

## 2021-02-10 LAB — HEMOGLOBIN A1C
Est. average glucose Bld gHb Est-mCnc: 108 mg/dL
Hgb A1c MFr Bld: 5.4 % (ref 4.8–5.6)

## 2021-02-19 ENCOUNTER — Encounter: Payer: Self-pay | Admitting: Nurse Practitioner

## 2021-05-26 ENCOUNTER — Encounter: Payer: Self-pay | Admitting: Nurse Practitioner

## 2021-06-15 ENCOUNTER — Other Ambulatory Visit: Payer: Self-pay

## 2021-06-15 ENCOUNTER — Encounter: Payer: Self-pay | Admitting: Nurse Practitioner

## 2021-06-15 ENCOUNTER — Ambulatory Visit: Payer: BC Managed Care – PPO | Admitting: Nurse Practitioner

## 2021-06-15 VITALS — BP 116/74 | HR 76 | Temp 98.5°F | Ht 68.0 in | Wt 179.6 lb

## 2021-06-15 DIAGNOSIS — E119 Type 2 diabetes mellitus without complications: Secondary | ICD-10-CM

## 2021-06-15 LAB — BMP8+EGFR
BUN/Creatinine Ratio: 9 (ref 9–20)
BUN: 9 mg/dL (ref 6–24)
CO2: 28 mmol/L (ref 20–29)
Calcium: 9.8 mg/dL (ref 8.7–10.2)
Chloride: 103 mmol/L (ref 96–106)
Creatinine, Ser: 0.97 mg/dL (ref 0.76–1.27)
Glucose: 121 mg/dL — ABNORMAL HIGH (ref 70–99)
Potassium: 4.7 mmol/L (ref 3.5–5.2)
Sodium: 144 mmol/L (ref 134–144)
eGFR: 101 mL/min/{1.73_m2} (ref >59)

## 2021-06-15 LAB — HEMOGLOBIN A1C
Est. average glucose Bld gHb Est-mCnc: 105 mg/dL
Hgb A1c MFr Bld: 5.3 % (ref 4.8–5.6)

## 2021-06-15 MED ORDER — OZEMPIC (0.25 OR 0.5 MG/DOSE) 2 MG/1.5ML ~~LOC~~ SOPN: 0.2500 mg | PEN_INJECTOR | SUBCUTANEOUS | 3 refills | Status: DC

## 2021-06-15 NOTE — Progress Notes (Addendum)
°I,Victoria T Hamilton,acting as a scribe for Talin Feister, FNP.,have documented all relevant documentation on the behalf of Khilee Hendricksen, FNP,as directed by  Jakhiya Brower, FNP while in the presence of Kelcee Bjorn, FNP.  ° °This visit occurred during the SARS-CoV-2 public health emergency.  Safety protocols were in place, including screening questions prior to the visit, additional usage of staff PPE, and extensive cleaning of exam room while observing appropriate contact time as indicated for disinfecting solutions. ° °Subjective:  °  ° Patient ID: Tim Tate , male    DOB: 03/24/1981 , 40 y.o.   MRN: [id number] ° ° °Chief Complaint  °Patient presents with  °• Diabetes  ° ° °HPI ° °Pt here for DM.  Reports not being able to get his Ozempic due to the back order and [facility] only does 90 day supply, locally [facility] on [address]  Continues to tolerate well.   ° °Wt Readings from Last 3 Encounters: °06/15/21 : 179 lb 9.6 oz (81.5 kg) °02/09/21 : 166 lb 3.2 oz (75.4 kg) °10/29/20 : 161 lb 9.6 oz (73.3 kg) ° ° ° °Diabetes °He presents for his follow-up diabetic visit. He has type 2 diabetes mellitus. There are no hypoglycemic associated symptoms. Pertinent negatives for hypoglycemia include no dizziness or headaches. There are no diabetic associated symptoms. Pertinent negatives for diabetes include no chest pain, no fatigue, no polydipsia, no polyphagia and no polyuria. There are no hypoglycemic complications. Risk factors for coronary artery disease include sedentary lifestyle and male sex. Current diabetic treatment includes oral agent (triple therapy). He has not had a previous visit with a dietitian. He rarely participates in exercise. (Reports blood sugar has been doing well. )   ° °Past Medical History:  °Diagnosis Date  °• Diabetes mellitus without complication (HCC)   ° Type 2  °  ° °History reviewed. No pertinent family history. ° ° °Current Outpatient Medications:  °•  Insulin Pen Needle  32G X 6 MM MISC, 1 each by Does not apply route once a week., Disp: 100 each, Rfl: 2 °•  Semaglutide,0.25 or 0.5MG/DOS, (OZEMPIC, 0.25 OR 0.5 MG/DOSE,) 2 MG/1.5ML SOPN, Inject 0.25 mg into the skin once a week., Disp: 1.5 mL, Rfl: 3  ° °No Known Allergies  ° °Review of Systems  °Constitutional: Negative.  Negative for fatigue.  °HENT: Negative.    °Eyes: Negative.   °Cardiovascular: Negative.  Negative for chest pain.  °Endocrine: Negative for polydipsia, polyphagia and polyuria.  °Genitourinary: Negative.   °Skin: Negative.   °Allergic/Immunologic: Negative.   °Neurological: Negative.  Negative for dizziness and headaches.  °Hematological: Negative.    ° °Today's Vitals  ° 06/15/21 0932  °BP: 116/74  °Pulse: 76  °Temp: 98.5 °F (36.9 °C)  °SpO2: 95%  °Weight: 179 lb 9.6 oz (81.5 kg)  °Height: 5' 8" (1.727 m)  ° °Body mass index is 27.31 kg/m².  °Wt Readings from Last 3 Encounters:  °06/15/21 179 lb 9.6 oz (81.5 kg)  °02/09/21 166 lb 3.2 oz (75.4 kg)  °10/29/20 161 lb 9.6 oz (73.3 kg)  °  °Objective:  °Physical Exam °Vitals reviewed.  °Constitutional:   °   General: He is not in acute distress. °   Appearance: Normal appearance.  °Cardiovascular:  °   Rate and Rhythm: Normal rate and regular rhythm.  °   Pulses: Normal pulses.  °   Heart sounds: Normal heart sounds. No murmur heard. °Pulmonary:  °   Effort: Pulmonary effort   is normal. No respiratory distress.  °   Breath sounds: Normal breath sounds. No wheezing.  °Skin: °   General: Skin is warm and dry.  °   Capillary Refill: Capillary refill takes less than 2 seconds.  °Neurological:  °   General: No focal deficit present.  °   Mental Status: He is alert and oriented to person, place, and time.  °   Cranial Nerves: No cranial nerve deficit.  °   Motor: No weakness.  °Psychiatric:     °   Mood and Affect: Mood normal.     °   Behavior: Behavior normal.     °   Thought Content: Thought content normal.     °   Judgment: Judgment normal.  °  ° °   °Assessment And  Plan:  °   °1. Type 2 diabetes mellitus without complication, without long-term current use of insulin (HCC) °Comments: Doing well last HgbA1c was 5.4, continue Ozempic 0.25 mg, [facility] since dispenses 3 month supply. If he is consistent with his HgbA1c being in the normal range for at least 6 months to one year will consider discontinuing medications °- Semaglutide,0.25 or 0.5MG/DOS, (OZEMPIC, 0.25 OR 0.5 MG/DOSE,) 2 MG/1.5ML SOPN; Inject 0.25 mg into the skin once a week.  Dispense: 1.5 mL; Refill: 3 °- BMP8+eGFR °- Hemoglobin A1c ° °Will check [facility] for his tetanus vaccine  ° °Patient was given opportunity to ask questions. Patient verbalized understanding of the plan and was able to repeat key elements of the plan. All questions were answered to their satisfaction.  °Pricella Gaugh, FNP  ° °I, Breane Grunwald, FNP, have reviewed all documentation for this visit. The documentation on 06/15/21 for the exam, diagnosis, procedures, and orders are all accurate and complete.  ° °IF YOU HAVE BEEN REFERRED TO A SPECIALIST, IT MAY TAKE 1-2 WEEKS TO SCHEDULE/PROCESS THE REFERRAL. IF YOU HAVE NOT HEARD FROM US/SPECIALIST IN TWO WEEKS, PLEASE GIVE US A CALL AT [phone].  ° °THE PATIENT IS ENCOURAGED TO PRACTICE SOCIAL DISTANCING DUE TO THE COVID-19 PANDEMIC.   ° °

## 2021-06-15 NOTE — Progress Notes (Incomplete Revision)
I,Victoria T Hamilton,acting as a Education administrator for Minette Brine, FNP.,have documented all relevant documentation on the behalf of Minette Brine, FNP,as directed by  Minette Brine, FNP while in the presence of Minette Brine, [place].   This visit occurred during the SARS-CoV-2 public health emergency.  Safety protocols were in place, including screening questions prior to the visit, additional usage of staff PPE, and extensive cleaning of exam room while observing appropriate contact time as indicated for disinfecting solutions.  Subjective:     Patient ID: Tim Tate , male    DOB: [birth year] , 40 y.o.   MRN: [id number]   Chief Complaint  Patient presents with   Diabetes    HPI  Pt here for DM.  Reports not being able to get his Ozempic due to the back order and [facility] only does 90 day supply, locally [facility] on [address].  Continues to tolerate well.    Wt Readings from Last 3 Encounters: 06/15/21 : 179 lb 9.6 oz (81.5 kg) 02/09/21 : 166 lb 3.2 oz (75.4 kg) 10/29/20 : 161 lb 9.6 oz (73.3 kg)    Diabetes He presents for his follow-up diabetic visit. He has type 2 diabetes mellitus. There are no hypoglycemic associated symptoms. Pertinent negatives for hypoglycemia include no dizziness or headaches. There are no diabetic associated symptoms. Pertinent negatives for diabetes include no chest pain, no fatigue, no polydipsia, no polyphagia and no polyuria. There are no hypoglycemic complications. Risk factors for coronary artery disease include sedentary lifestyle and male sex. Current diabetic treatment includes oral agent (triple therapy). He has not had a previous visit with a dietitian. He rarely participates in exercise. (Reports blood sugar has been doing well. )    Past Medical History:  Diagnosis Date   Diabetes mellitus without complication ([place])    Type 2     History reviewed. No pertinent family history.   Current Outpatient Medications:    Insulin Pen Needle  32G X 6 MM MISC, 1 each by Does not apply route once a week., Disp: 100 each, Rfl: 2   Semaglutide,0.25 or 0.5MG/DOS, (OZEMPIC, 0.25 OR 0.5 MG/DOSE,) 2 MG/1.5ML SOPN, Inject 0.25 mg into the skin once a week., Disp: 1.5 mL, Rfl: 3   No Known Allergies   Review of Systems  Constitutional: Negative.  Negative for fatigue.  HENT: Negative.    Eyes: Negative.   Cardiovascular: Negative.  Negative for chest pain.  Endocrine: Negative for polydipsia, polyphagia and polyuria.  Genitourinary: Negative.   Skin: Negative.   Allergic/Immunologic: Negative.   Neurological: Negative.  Negative for dizziness and headaches.  Hematological: Negative.     Today's Vitals   06/15/21 0932  BP: 116/74  Pulse: 76  Temp: 98.5 F (36.9 C)  SpO2: 95%  Weight: 179 lb 9.6 oz (81.5 kg)  Height: _0  (1.727 m)   Body mass index is 27.31 kg/m.  Wt Readings from Last 3 Encounters:  06/15/21 179 lb 9.6 oz (81.5 kg)  02/09/21 166 lb 3.2 oz (75.4 kg)  10/29/20 161 lb 9.6 oz (73.3 kg)    Objective:  Physical Exam Vitals reviewed.  Constitutional:      General: He is not in acute distress.    Appearance: Normal appearance.  Cardiovascular:     Rate and Rhythm: Normal rate and regular rhythm.     Pulses: Normal pulses.     Heart sounds: Normal heart sounds. No murmur heard. Pulmonary:     Effort: Pulmonary effort  is normal. No respiratory distress.     Breath sounds: Normal breath sounds. No wheezing.  Skin:    General: Skin is warm and dry.     Capillary Refill: Capillary refill takes less than 2 seconds.  Neurological:     General: No focal deficit present.     Mental Status: He is alert and oriented to person, place, and time.     Cranial Nerves: No cranial nerve deficit.     Motor: No weakness.  Psychiatric:        Mood and Affect: Mood normal.        Behavior: Behavior normal.        Thought Content: Thought content normal.        Judgment: Judgment normal.        Assessment And  Plan:     1. Type 2 diabetes mellitus without complication, without long-term current use of insulin (HCC) Comments: Doing well last HgbA1c was 5.4, continue Ozempic 0.25 mg, [facility] since dispenses 3 month supply. If he is consistent with his HgbA1c being in the normal range for at least 6 months to one year will consider discontinuing medications - Semaglutide,0.25 or 0.5MG/DOS, (OZEMPIC, 0.25 OR 0.5 MG/DOSE,) 2 MG/1.5ML SOPN; Inject 0.25 mg into the skin once a week.  Dispense: 1.5 mL; Refill: 3 - BMP8+eGFR - Hemoglobin A1c  Will check [facility] for his tetanus vaccine   Patient was given opportunity to ask questions. Patient verbalized understanding of the plan and was able to repeat key elements of the plan. All questions were answered to their satisfaction.  Minette Brine, FNP   I, Minette Brine, FNP, have reviewed all documentation for this visit. The documentation on 06/15/21 for the exam, diagnosis, procedures, and orders are all accurate and complete.   IF YOU HAVE BEEN REFERRED TO A SPECIALIST, IT MAY TAKE 1-2 WEEKS TO SCHEDULE/PROCESS THE REFERRAL. IF YOU HAVE NOT HEARD FROM US/SPECIALIST IN TWO WEEKS, PLEASE GIVE [place] A CALL AT [phone].   THE PATIENT IS ENCOURAGED TO PRACTICE SOCIAL DISTANCING DUE TO THE COVID-19 PANDEMIC.

## 2021-06-15 NOTE — Patient Instructions (Signed)
Type 2 Diabetes Mellitus, Diagnosis, Adult °Type 2 diabetes (type 2 diabetes mellitus) is a long-term (chronic) disease. It may happen when there is one or both of these problems: °The pancreas does not make enough insulin. °The body does not react in a normal way to insulin that it makes. °Insulin lets sugars go into cells in your body. If you have type 2 diabetes, sugars cannot get into your cells. Sugars build up in the blood. This causes high blood sugar. °What are the causes? °The exact cause of this condition is not known. °What increases the risk? °Having type 2 diabetes in your family. °Being overweight or very overweight. °Not being active. °Your body not reacting in a normal way to the insulin it makes. °Having higher than normal blood sugar over time. °Having a type of diabetes when you were pregnant. °Having a condition that causes small fluid-filled sacs on your ovaries. °What are the signs or symptoms? °At first, you may have no symptoms. You will get symptoms slowly. They may include: °More thirst than normal. °More hunger than normal. °Needing to pee more than normal. °Losing weight without trying. °Feeling tired. °Feeling weak. °Seeing things blurry. °Dark patches on your skin. °How is this treated? °This condition may be treated by a diabetes expert. You may need to: °Follow an eating plan made by a food expert (dietitian). °Get regular exercise. °Find ways to deal with stress. °Check blood sugar as often as told. °Take medicines. °Your doctor will set treatment goals for you. Your blood sugar should be at these levels: °Before meals: 80-130 mg/dL (4.4-7.2 mmol/L). °After meals: below 180 mg/dL (10 mmol/L). °Over the last 2-3 months: less than 7%. °Follow these instructions at home: °Medicines °Take your diabetes medicines or insulin every day. °Take medicines as told to help you prevent other problems caused by this condition. You may need: °Aspirin. °Medicine to lower cholesterol. °Medicine to  control blood pressure. °Questions to ask your doctor °Should I meet with a diabetes educator? °What medicines do I need, and when should I take them? °What will I need to treat my condition at home? °When should I check my blood sugar? °Where can I find a support group? °Who can I call if I have questions? °When is my next doctor visit? °General instructions °Take over-the-counter and prescription medicines only as told by your doctor. °Keep all follow-up visits. °Where to find more information °For help and guidance and more information about diabetes, please go to: °American Diabetes Association (ADA): www.diabetes.org °American Association of Diabetes Care and Education Specialists (ADCES): www.diabeteseducator.org °International Diabetes Federation (IDF): www.idf.org °Contact a doctor if: °Your blood sugar is at or above 240 mg/dL (13.3 mmol/L) for 2 days in a row. °You have been sick for 2 days or more, and you are not getting better. °You have had a fever for 2 days or more, and you are not getting better. °You have any of these problems for more than 6 hours: °You cannot eat or drink. °You feel like you may vomit. °You vomit. °You have watery poop (diarrhea). °Get help right away if: °Your blood sugar is lower than 54 mg/dL (3 mmol/L). °You feel mixed up (confused). °You have trouble thinking clearly. °You have trouble breathing. °You have medium or large ketone levels in your pee. °These symptoms may be an emergency. Get help right away. Call your local emergency services (911 in the [place] °Do not wait to see if the symptoms will go away. °Do not drive yourself   to the hospital. °Summary °Type 2 diabetes is a long-term disease. Your pancreas may not make enough insulin, or your body may not react in a normal way to insulin that it makes. °This condition is treated with an eating plan, lifestyle changes, and medicines. °Your doctor will set treatment goals for you. These will help you keep your blood sugar  in a healthy range. °Keep all follow-up visits. °This information is not intended to replace advice given to you by your health care provider. Make sure you discuss any questions you have with your health care provider. °Document Revised: 10/12/2020 Document Reviewed: 10/12/2020 °Elsevier Patient Education © 2022 Elsevier Inc. ° °

## 2021-06-16 ENCOUNTER — Encounter: Payer: Self-pay | Admitting: Nurse Practitioner

## 2021-06-30 ENCOUNTER — Other Ambulatory Visit: Payer: Self-pay | Admitting: Nurse Practitioner

## 2021-06-30 DIAGNOSIS — E119 Type 2 diabetes mellitus without complications: Secondary | ICD-10-CM

## 2021-10-14 ENCOUNTER — Ambulatory Visit: Payer: BC Managed Care – PPO | Admitting: Nurse Practitioner

## 2021-10-25 ENCOUNTER — Other Ambulatory Visit: Payer: Self-pay

## 2021-10-25 ENCOUNTER — Ambulatory Visit: Payer: BC Managed Care – PPO | Admitting: Nurse Practitioner

## 2021-10-25 ENCOUNTER — Encounter: Payer: Self-pay | Admitting: Nurse Practitioner

## 2021-10-25 VITALS — BP 126/84 | HR 71 | Temp 98.2°F | Ht 68.0 in | Wt 189.0 lb

## 2021-10-25 DIAGNOSIS — Z2821 Immunization not carried out because of patient refusal: Secondary | ICD-10-CM | POA: Diagnosis not present

## 2021-10-25 DIAGNOSIS — E119 Type 2 diabetes mellitus without complications: Secondary | ICD-10-CM

## 2021-10-25 NOTE — Progress Notes (Signed)
?[employer] as a Neurosurgeon for [employer], FNP.,have documented all relevant documentation on the behalf of [name], FNP,as directed by  [name], FNP while in the presence of [name], FNP.  ? ?This visit occurred during the SARS-CoV-2 public health emergency.  Safety protocols were in place, including screening questions prior to the visit, additional usage of staff PPE, and extensive cleaning of exam room while observing appropriate contact time as indicated for disinfecting solutions. ? ?Subjective:  ?  ? Patient ID: Tim Tate , male    DOB: 11-30-1980 , 41 y.o.   MRN: [id number] ? ? ?Chief Complaint  ?Patient presents with  ? Diabetes  ? ? ?HPI ? ?The patient is here for a diabetes f/u.  Continues with Ozempic, he has been able to get refilled. Bowling 3 days a week. Does exercise with his group.   ? ?Wt Readings from Last 3 Encounters: ?10/25/21 : 189 lb (85.7 kg) ?06/15/21 : 179 lb 9.6 oz (81.5 kg) ?02/09/21 : 166 lb 3.2 oz (75.4 kg) ? ? ? ?Diabetes ?He presents for his follow-up diabetic visit. He has type 2 diabetes mellitus. There are no hypoglycemic associated symptoms. Pertinent negatives for hypoglycemia include no dizziness or headaches. There are no diabetic associated symptoms. Pertinent negatives for diabetes include no chest pain, no fatigue, no polydipsia, no polyphagia and no polyuria. There are no hypoglycemic complications. There are no diabetic complications. Risk factors for coronary artery disease include sedentary lifestyle and male sex. Current diabetic treatment includes oral agent (triple therapy). He has not had a previous visit with a dietitian. He rarely participates in exercise. (Has not checked his blood sugar has not felt bad. )   ? ?Past Medical History:  ?Diagnosis Date  ? Diabetes mellitus without complication (HCC)   ? Type 2  ?  ? ?History reviewed. No pertinent family history. ? ? ?Current Outpatient Medications:  ?  OZEMPIC, 0.25 OR 0.5  MG/DOSE, 2 MG/1.5ML SOPN, INJECT 0.25 MG  SUBCUTANEOUSLY ONCE WEEKLY, Disp: 3 mL, Rfl: 3 ?  Insulin Pen Needle 32G X 6 MM MISC, 1 each by Does not apply route once a week., Disp: 100 each, Rfl: 2  ? ?No Known Allergies  ? ?Review of Systems  ?Constitutional: Negative.  Negative for fatigue.  ?Cardiovascular:  Negative for chest pain.  ?Endocrine: Negative for polydipsia, polyphagia and polyuria.  ?Neurological:  Negative for dizziness and headaches.  ?Psychiatric/Behavioral: Negative.     ? ?Today's Vitals  ? 10/25/21 1440  ?BP: 126/84  ?Pulse: 71  ?Temp: 98.2 ?F (36.8 ?C)  ?Weight: 189 lb (85.7 kg)  ?Height: 5\' 8"  (1.727 m)  ? ?Body mass index is 28.74 kg/m?.  ?Wt Readings from Last 3 Encounters:  ?10/25/21 189 lb (85.7 kg)  ?06/15/21 179 lb 9.6 oz (81.5 kg)  ?02/09/21 166 lb 3.2 oz (75.4 kg)  ?  ?BP Readings from Last 3 Encounters:  ?10/25/21 126/84  ?06/15/21 116/74  ?02/09/21 124/80  ?  ?Objective:  ?Physical Exam ?Vitals reviewed.  ?Constitutional:   ?   General: He is not in acute distress. ?   Appearance: Normal appearance.  ?Cardiovascular:  ?   Rate and Rhythm: Normal rate and regular rhythm.  ?   Pulses: Normal pulses.  ?   Heart sounds: Normal heart sounds. No murmur heard. ?Pulmonary:  ?   Effort: Pulmonary effort is normal. No respiratory distress.  ?   Breath sounds: Normal breath sounds. No wheezing.  ?Skin: ?  General: Skin is warm and dry.  ?   Capillary Refill: Capillary refill takes less than 2 seconds.  ?Neurological:  ?   General: No focal deficit present.  ?   Mental Status: He is alert and oriented to person, place, and time.  ?   Cranial Nerves: No cranial nerve deficit.  ?   Motor: No weakness.  ?Psychiatric:     ?   Mood and Affect: Mood normal.     ?   Behavior: Behavior normal.     ?   Thought Content: Thought content normal.     ?   Judgment: Judgment normal.  ?  ? ?   ?Assessment And Plan:  ?   ?1. Type 2 diabetes mellitus without complication, without long-term current use of insulin  ([place]) ?Comments: He is doing well with improved HgbA1c, continue Ozempic at 0.25 mg ?- Microalbumin / Creatinine Urine Ratio ?- Hemoglobin A1c ?- BMP8+EGFR ? ?2. Tetanus, diphtheria, and acellular pertussis (Tdap) vaccination declined ?He is advised if he has a cut or step on rusty nail he will need a tdap ? ? ?Patient was given opportunity to ask questions. Patient verbalized understanding of the plan and was able to repeat key elements of the plan. All questions were answered to their satisfaction.  ?Minette Brine, FNP  ? ?I, Minette Brine, FNP, have reviewed all documentation for this visit. The documentation on 10/25/21 for the exam, diagnosis, procedures, and orders are all accurate and complete.  ? ?IF YOU HAVE BEEN REFERRED TO A SPECIALIST, IT MAY TAKE 1-2 WEEKS TO SCHEDULE/PROCESS THE REFERRAL. IF YOU HAVE NOT HEARD FROM US/SPECIALIST IN TWO WEEKS, PLEASE GIVE [place] A CALL AT [phone].  ? ?THE PATIENT IS ENCOURAGED TO PRACTICE SOCIAL DISTANCING DUE TO THE COVID-19 PANDEMIC.   ?

## 2021-10-25 NOTE — Patient Instructions (Signed)

## 2021-10-26 LAB — BMP8+EGFR
BUN/Creatinine Ratio: 10 (ref 9–20)
BUN: 9 mg/dL (ref 6–24)
CO2: 25 mmol/L (ref 20–29)
Calcium: 9.2 mg/dL (ref 8.7–10.2)
Chloride: 107 mmol/L — ABNORMAL HIGH (ref 96–106)
Creatinine, Ser: 0.91 mg/dL (ref 0.76–1.27)
Glucose: 91 mg/dL (ref 70–99)
Potassium: 4.4 mmol/L (ref 3.5–5.2)
Sodium: 149 mmol/L — ABNORMAL HIGH (ref 134–144)
eGFR: 108 mL/min/{1.73_m2} (ref >59)

## 2021-10-26 LAB — HEMOGLOBIN A1C
Est. average glucose Bld gHb Est-mCnc: 111 mg/dL
Hgb A1c MFr Bld: 5.5 % (ref 4.8–5.6)

## 2021-10-26 LAB — MICROALBUMIN / CREATININE URINE RATIO
Creatinine, Urine: 183.9 mg/dL
Microalb/Creat Ratio: 11 mg/g creat (ref 0–29)
Microalbumin, Urine: 21 ug/mL

## 2022-02-14 ENCOUNTER — Encounter: Payer: Self-pay | Admitting: Nurse Practitioner

## 2022-02-14 ENCOUNTER — Other Ambulatory Visit (HOSPITAL_COMMUNITY)
Admission: RE | Admit: 2022-02-14 | Discharge: 2022-02-14 | Disposition: A | Discharge status: Home / Self Care | Payer: BC Managed Care – PPO | Source: Ambulatory Visit | Attending: Nurse Practitioner | Admitting: Nurse Practitioner

## 2022-02-14 ENCOUNTER — Ambulatory Visit (INDEPENDENT_AMBULATORY_CARE_PROVIDER_SITE_OTHER): Payer: BC Managed Care – PPO | Admitting: Nurse Practitioner

## 2022-02-14 VITALS — BP 160/88 | HR 94 | Temp 98.3°F | Ht 67.8 in | Wt 191.6 lb

## 2022-02-14 DIAGNOSIS — Z0001 Encounter for general adult medical examination with abnormal findings: Secondary | ICD-10-CM

## 2022-02-14 DIAGNOSIS — Z125 Encounter for screening for malignant neoplasm of prostate: Secondary | ICD-10-CM | POA: Diagnosis not present

## 2022-02-14 DIAGNOSIS — E0843 Diabetes mellitus due to underlying condition with diabetic autonomic (poly)neuropathy: Secondary | ICD-10-CM | POA: Diagnosis not present

## 2022-02-14 DIAGNOSIS — E119 Type 2 diabetes mellitus without complications: Secondary | ICD-10-CM | POA: Diagnosis not present

## 2022-02-14 DIAGNOSIS — Z113 Encounter for screening for infections with a predominantly sexual mode of transmission: Secondary | ICD-10-CM | POA: Diagnosis not present

## 2022-02-14 DIAGNOSIS — H6121 Impacted cerumen, right ear: Secondary | ICD-10-CM | POA: Diagnosis not present

## 2022-02-14 DIAGNOSIS — Z6829 Body mass index (BMI) 29.0-29.9, adult: Secondary | ICD-10-CM

## 2022-02-14 DIAGNOSIS — Z0000 Encounter for general adult medical examination without abnormal findings: Secondary | ICD-10-CM

## 2022-02-14 DIAGNOSIS — R221 Localized swelling, mass and lump, neck: Secondary | ICD-10-CM | POA: Diagnosis not present

## 2022-02-14 NOTE — Patient Instructions (Signed)
Health Maintenance, Male Adopting a healthy lifestyle and getting preventive care are important in promoting health and wellness. Ask your health care provider about: The right schedule for you to have regular tests and exams. Things you can do on your own to prevent diseases and keep yourself healthy. What should I know about diet, weight, and exercise? Eat a healthy diet  Eat a diet that includes plenty of vegetables, fruits, low-fat dairy products, and lean protein. Do not eat a lot of foods that are high in solid fats, added sugars, or sodium. Maintain a healthy weight Body mass index (BMI) is a measurement that can be used to identify possible weight problems. It estimates body fat based on height and weight. Your health care provider can help determine your BMI and help you achieve or maintain a healthy weight. Get regular exercise Get regular exercise. This is one of the most important things you can do for your health. Most adults should: Exercise for at least 150 minutes each week. The exercise should increase your heart rate and make you sweat (moderate-intensity exercise). Do strengthening exercises at least twice a week. This is in addition to the moderate-intensity exercise. Spend less time sitting. Even light physical activity can be beneficial. Watch cholesterol and blood lipids Have your blood tested for lipids and cholesterol at 42 years of age, then have this test every 5 years. You may need to have your cholesterol levels checked more often if: Your lipid or cholesterol levels are high. You are older than 42 years of age. You are at high risk for heart disease. What should I know about cancer screening? Many types of cancers can be detected early and may often be prevented. Depending on your health history and family history, you may need to have cancer screening at various ages. This may include screening for: Colorectal cancer. Prostate cancer. Skin cancer. Lung  cancer. What should I know about heart disease, diabetes, and high blood pressure? Blood pressure and heart disease High blood pressure causes heart disease and increases the risk of stroke. This is more likely to develop in people who have high blood pressure readings or are overweight. Talk with your health care provider about your target blood pressure readings. Have your blood pressure checked: Every 3-5 years if you are 18-39 years of age. Every year if you are 40 years old or older. If you are between the ages of 65 and 75 and are a current or former smoker, ask your health care provider if you should have a one-time screening for abdominal aortic aneurysm (AAA). Diabetes Have regular diabetes screenings. This checks your fasting blood sugar level. Have the screening done: Once every three years after age 45 if you are at a normal weight and have a low risk for diabetes. More often and at a younger age if you are overweight or have a high risk for diabetes. What should I know about preventing infection? Hepatitis B If you have a higher risk for hepatitis B, you should be screened for this virus. Talk with your health care provider to find out if you are at risk for hepatitis B infection. Hepatitis C Blood testing is recommended for: Everyone born from 1945 through 1965. Anyone with known risk factors for hepatitis C. Sexually transmitted infections (STIs) You should be screened each year for STIs, including gonorrhea and chlamydia, if: You are sexually active and are younger than 42 years of age. You are older than 42 years of age and your   health care provider tells you that you are at risk for this type of infection. Your sexual activity has changed since you were last screened, and you are at increased risk for chlamydia or gonorrhea. Ask your health care provider if you are at risk. Ask your health care provider about whether you are at high risk for HIV. Your health care provider  may recommend a prescription medicine to help prevent HIV infection. If you choose to take medicine to prevent HIV, you should first get tested for HIV. You should then be tested every 3 months for as long as you are taking the medicine. Follow these instructions at home: Alcohol use Do not drink alcohol if your health care provider tells you not to drink. If you drink alcohol: Limit how much you have to 0-2 drinks a day. Know how much alcohol is in your drink. In the [place] one drink equals one 12 oz bottle of beer (355 mL), one 5 oz glass of wine (148 mL), or one 1 oz glass of hard liquor (44 mL). Lifestyle Do not use any products that contain nicotine or tobacco. These products include cigarettes, chewing tobacco, and vaping devices, such as e-cigarettes. If you need help quitting, ask your health care provider. Do not use street drugs. Do not share needles. Ask your health care provider for help if you need support or information about quitting drugs. General instructions Schedule regular health, dental, and eye exams. Stay current with your vaccines. Tell your health care provider if: You often feel depressed. You have ever been abused or do not feel safe at home. Summary Adopting a healthy lifestyle and getting preventive care are important in promoting health and wellness. Follow your health care provider's instructions about healthy diet, exercising, and getting tested or screened for diseases. Follow your health care provider's instructions on monitoring your cholesterol and blood pressure. This information is not intended to replace advice given to you by your health care provider. Make sure you discuss any questions you have with your health care provider. Document Revised: 12/07/2020 Document Reviewed: 12/07/2020 Elsevier Patient Education  2023 Elsevier Inc.  

## 2022-02-14 NOTE — Progress Notes (Signed)
Rich Brave [place],acting as a Education administrator for Minette Brine, FNP.,have documented all relevant documentation on the behalf of Minette Brine, FNP,as directed by  Minette Brine, FNP while in the presence of Minette Brine, [place].   Subjective:     Patient ID: Tim Tate , male    DOB: 1981-07-02 , 41 y.o.   MRN: [id number]   Chief Complaint  Patient presents with   Annual Exam    HPI  Patient presents today for a physical. He has an appt with his ophthalmologist tomorrow - Dr. Theodosia Quay ([place])  [facility] Readings from Last 3 Encounters: 02/14/22 : 191 lb 9.6 oz (86.9 kg) 10/25/21 : 189 lb (85.7 kg) 06/15/21 : 179 lb 9.6 oz (81.5 kg)     Diabetes He presents for his follow-up diabetic visit. He has type 2 diabetes mellitus. There are no hypoglycemic associated symptoms. There are no diabetic associated symptoms. There are no hypoglycemic complications. Risk factors for coronary artery disease include sedentary lifestyle and male sex. Current diabetic treatment includes oral agent (triple therapy). Diabetic current diet: Regular diet. He has not had a previous visit with a dietitian. He rarely participates in exercise. 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 No pertinent family history.   Current Outpatient Medications:    OZEMPIC, 0.25 OR 0.5 MG/DOSE, 2 MG/1.5ML SOPN, INJECT 0.25 MG  SUBCUTANEOUSLY ONCE WEEKLY, Disp: 3 mL, Rfl: 3   Insulin Pen Needle 32G X 6 MM MISC, 1 each by Does not apply route once a week., Disp: 100 each, Rfl: 2   lidocaine (LIDODERM) 5 %, Place 1 patch onto the skin daily. Remove & Discard patch within 12 hours or as directed by MD, Disp: 30 patch, Rfl: 0   methocarbamol (ROBAXIN) 500 MG tablet, Take 1 tablet (500 mg total) by mouth 2 (two) times daily., Disp:  20 tablet, Rfl: 0   No Known Allergies   Men's preventive visit. Patient Health Questionnaire (PHQ-2) is  [place] Office Visit from 10/25/2021 in [facility]  PHQ-2 Total Score 0     Patient is on a regular diet. Bowling 3 times a week, walks his dog daily. He is also working with his lawn care business.  Marital status: Single. Relevant history for alcohol use is:  Social History   Substance and Sexual Activity  Alcohol Use No   Relevant history for tobacco use is:  Social History   Tobacco Use  Smoking Status Never  Smokeless Tobacco Never  .   Review of Systems  Constitutional: Negative.   HENT: Negative.    Eyes: Negative.   Respiratory: Negative.    Cardiovascular: Negative.   Gastrointestinal: Negative.   Endocrine: Negative.   Genitourinary: Negative.   Musculoskeletal: Negative.   Skin: Negative.   Neurological: Negative.   Hematological: Negative.   Psychiatric/Behavioral: Negative.       Today's Vitals   02/14/22 1117 02/14/22 1218  BP: (!) 142/90 (!) 160/88  Pulse: 94   Temp: 98.3 F (36.8 C)   Weight: 191 lb 9.6 oz (86.9 kg)   Height: 5' 7.8" (1.722 m)   PainSc: 0-No pain    Body mass index is 29.3 kg/m.   Objective:  Physical Exam Vitals reviewed.  Constitutional:  General: He is not in acute distress.    Appearance: Normal appearance.  HENT:     Head: Normocephalic and atraumatic.     Right Ear: External ear normal. There is impacted cerumen.     Left Ear: Tympanic membrane, ear canal and external ear normal. There is no impacted cerumen.     Nose: Nose normal.     Mouth/Throat:     Mouth: Mucous membranes are moist.  Eyes:     Extraocular Movements: Extraocular movements intact.     Conjunctiva/sclera: Conjunctivae normal.     Pupils: Pupils are equal, round, and reactive to light.  Cardiovascular:     Rate and Rhythm: Normal rate and regular rhythm.     Pulses: Normal pulses.     Heart sounds:  Normal heart sounds. No murmur heard. Pulmonary:     Effort: Pulmonary effort is normal. No respiratory distress.     Breath sounds: Normal breath sounds. No wheezing.  Abdominal:     General: Abdomen is flat. Bowel sounds are normal. There is no distension.     Palpations: Abdomen is soft.     Tenderness: There is no abdominal tenderness.  Genitourinary:    Comments: Deferred Musculoskeletal:        General: No swelling or tenderness. Normal range of motion.     Cervical back: Normal range of motion and neck supple.  Skin:    General: Skin is warm.     Capillary Refill: Capillary refill takes less than 2 seconds.  Neurological:     General: No focal deficit present.     Mental Status: He is alert and oriented to person, place, and time.     Cranial Nerves: No cranial nerve deficit.     Motor: No weakness.  Psychiatric:        Mood and Affect: Mood normal.        Behavior: Behavior normal.        Thought Content: Thought content normal.        Judgment: Judgment normal.         Assessment And Plan:    1. Encounter for general adult medical examination w/o abnormal findings Behavior modifications discussed and diet history reviewed.   Pt will continue to exercise regularly and modify diet with low GI, plant based foods and decrease intake of processed foods.  Recommend intake of daily multivitamin, Vitamin D, and calcium.  Recommend preventive screenings, as well as recommend immunizations that include influenza, TDAP, and Shingles - Lipid panel - CBC no Diff - Hemoglobin A1c  2. Screening for STD (sexually transmitted disease) - Hepatitis B surface antigen - Hepatitis C antibody - HSV(herpes simplex vrs) 1+2 ab-IgG - RPR - HIV Antibody (routine testing w rflx) - Urine cytology ancillary only  3. Encounter for prostate cancer screening Comments: Will check PSA level - PSA  4. Diabetes mellitus due to underlying condition with diabetic autonomic neuropathy,  unspecified whether long term insulin use (HCC) Comments: Doing better with his Ozempic - POCT Urinalysis Dipstick (81002) - Microalbumin / Creatinine Urine Ratio - CMP14+EGFR  5. Lump on neck Comments: present 5-6 years no change in size, has not had imaging. Will send for ultrasound I suspect this is a lipoma - US Soft Tissue Head/Neck (NON-THYROID); Future  6. Right ear impacted cerumen Comments: Water lavage done with good success - Ear Lavage  7. BMI 29.0-29.9,adult     Patient was given opportunity to ask questions. Patient verbalized understanding of the plan  and was able to repeat key elements of the plan. All questions were answered to their satisfaction.   Minette Brine, FNP   I, Minette Brine, FNP, have reviewed all documentation for this visit. The documentation on 02/14/22 for the exam, diagnosis, procedures, and orders are all accurate and complete.   THE PATIENT IS ENCOURAGED TO PRACTICE SOCIAL DISTANCING DUE TO THE COVID-19 PANDEMIC.

## 2022-02-15 LAB — CMP14+EGFR
ALT: 23 IU/L (ref 0–44)
AST: 23 IU/L (ref 0–40)
Albumin/Globulin Ratio: 1.6 (ref 1.2–2.2)
Albumin: 4.6 g/dL (ref 4.1–5.1)
Alkaline Phosphatase: 97 IU/L (ref 44–121)
BUN/Creatinine Ratio: 8 — ABNORMAL LOW (ref 9–20)
BUN: 8 mg/dL (ref 6–24)
Bilirubin Total: 0.4 mg/dL (ref 0.0–1.2)
CO2: 26 mmol/L (ref 20–29)
Calcium: 9.6 mg/dL (ref 8.7–10.2)
Chloride: 104 mmol/L (ref 96–106)
Creatinine, Ser: 1.02 mg/dL (ref 0.76–1.27)
Globulin, Total: 2.8 g/dL (ref 1.5–4.5)
Glucose: 134 mg/dL — ABNORMAL HIGH (ref 70–99)
Potassium: 4.8 mmol/L (ref 3.5–5.2)
Sodium: 143 mmol/L (ref 134–144)
Total Protein: 7.4 g/dL (ref 6.0–8.5)
eGFR: 94 mL/min/{1.73_m2} (ref >59)

## 2022-02-15 LAB — LIPID PANEL
Chol/HDL Ratio: 2.9 ratio (ref 0.0–5.0)
Cholesterol, Total: 155 mg/dL (ref 100–199)
HDL: 53 mg/dL (ref >39)
LDL Chol Calc (NIH): 90 mg/dL (ref 0–99)
Triglycerides: 56 mg/dL (ref 0–149)
VLDL Cholesterol Cal: 12 mg/dL (ref 5–40)

## 2022-02-15 LAB — HEPATITIS B SURFACE ANTIGEN: Hepatitis B Surface Ag: NEGATIVE

## 2022-02-15 LAB — CBC
Hematocrit: 46.8 % (ref 37.5–51.0)
Hemoglobin: 15.5 g/dL (ref 13.0–17.7)
MCH: 29.3 pg (ref 26.6–33.0)
MCHC: 33.1 g/dL (ref 31.5–35.7)
MCV: 89 fL (ref 79–97)
Platelets: 175 10*3/uL (ref 150–450)
RBC: 5.29 x10E6/uL (ref 4.14–5.80)
RDW: 13.9 % (ref 11.6–15.4)
WBC: 3.7 10*3/uL (ref 3.4–10.8)

## 2022-02-15 LAB — HEPATITIS C ANTIBODY: Hep C Virus Ab: NONREACTIVE

## 2022-02-15 LAB — HIV ANTIBODY (ROUTINE TESTING W REFLEX): HIV Screen 4th Generation wRfx: NONREACTIVE

## 2022-02-15 LAB — PSA: Prostate Specific Ag, Serum: 0.8 ng/mL (ref 0.0–4.0)

## 2022-02-15 LAB — HSV(HERPES SIMPLEX VRS) I + II AB-IGG
HSV 1 Glycoprotein G Ab, IgG: 3.09 index — ABNORMAL HIGH (ref 0.00–0.90)
HSV 2 IgG, Type Spec: 23 index — ABNORMAL HIGH (ref 0.00–0.90)

## 2022-02-15 LAB — HEMOGLOBIN A1C
Est. average glucose Bld gHb Est-mCnc: 117 mg/dL
Hgb A1c MFr Bld: 5.7 % — ABNORMAL HIGH (ref 4.8–5.6)

## 2022-02-15 LAB — MICROALBUMIN / CREATININE URINE RATIO
Creatinine, Urine: 358.7 mg/dL
Microalb/Creat Ratio: 7 mg/g creat (ref 0–29)
Microalbumin, Urine: 25.9 ug/mL

## 2022-02-15 LAB — RPR: RPR Ser Ql: NONREACTIVE

## 2022-02-17 ENCOUNTER — Ambulatory Visit
Admission: RE | Admit: 2022-02-17 | Discharge: 2022-02-17 | Disposition: A | Discharge status: Home / Self Care | Payer: BC Managed Care – PPO | Source: Ambulatory Visit | Attending: Nurse Practitioner | Admitting: Nurse Practitioner

## 2022-02-17 DIAGNOSIS — R221 Localized swelling, mass and lump, neck: Secondary | ICD-10-CM

## 2022-02-17 LAB — URINE CYTOLOGY ANCILLARY ONLY
Candida Urine: NEGATIVE
Chlamydia: NEGATIVE
Comment: NEGATIVE
Comment: NEGATIVE
Comment: NORMAL
Neisseria Gonorrhea: NEGATIVE
Trichomonas: NEGATIVE

## 2022-02-22 ENCOUNTER — Encounter: Payer: Self-pay | Admitting: Nurse Practitioner

## 2022-02-22 DIAGNOSIS — E133299 Other specified diabetes mellitus with mild nonproliferative diabetic retinopathy without macular edema, unspecified eye: Secondary | ICD-10-CM | POA: Insufficient documentation

## 2022-02-22 HISTORY — DX: Other specified diabetes mellitus with mild nonproliferative diabetic retinopathy without macular edema, unspecified eye: E13.3299

## 2022-02-26 ENCOUNTER — Encounter: Payer: Self-pay | Admitting: Nurse Practitioner

## 2022-02-27 ENCOUNTER — Other Ambulatory Visit: Payer: Self-pay

## 2022-02-27 ENCOUNTER — Ambulatory Visit (HOSPITAL_COMMUNITY)
Admission: RE | Admit: 2022-02-27 | Discharge: 2022-02-27 | Disposition: A | Discharge status: Home / Self Care | Payer: BC Managed Care – PPO | Source: Ambulatory Visit | Attending: Physician Assistant | Admitting: Physician Assistant

## 2022-02-27 ENCOUNTER — Ambulatory Visit (INDEPENDENT_AMBULATORY_CARE_PROVIDER_SITE_OTHER): Payer: BC Managed Care – PPO

## 2022-02-27 ENCOUNTER — Encounter (HOSPITAL_COMMUNITY): Payer: Self-pay

## 2022-02-27 VITALS — BP 153/99 | HR 74 | Temp 99.1°F | Resp 18

## 2022-02-27 DIAGNOSIS — R030 Elevated blood-pressure reading, without diagnosis of hypertension: Secondary | ICD-10-CM | POA: Diagnosis not present

## 2022-02-27 DIAGNOSIS — M545 Low back pain, unspecified: Secondary | ICD-10-CM

## 2022-02-27 MED ORDER — METHOCARBAMOL 500 MG PO TABS: 500.0000 mg | ORAL_TABLET | Freq: Two times a day (BID) | ORAL | 0 refills | Status: DC

## 2022-02-27 MED ORDER — LIDOCAINE 5 % EX PTCH: 1.0000 | MEDICATED_PATCH | CUTANEOUS | 0 refills | Status: DC

## 2022-02-27 NOTE — ED Notes (Signed)
Patient was discharged by raspet, pa

## 2022-02-27 NOTE — ED Provider Notes (Signed)
[place] [facility]    CSN: [id number] Arrival date & time: 02/27/22  1234      History   Chief Complaint Chief Complaint  Patient presents with   Back Pain   Appointment    12:45    HPI Tim Tate is a 41 y.o. male.   Patient presents today with a 1 week history of left-sided lower back pain.  Reports that he picked up his 80 pound dog in order to prevent an altercation between other dogs and felt a pop/strain in his back.  He has had ongoing pain since that time.  Pain has improved some over the past several days and is currently rated 3 on a 0-10 pain scale, described as aching, localized to left lower back without radiation, no alleviating factors identified.  Denies any bowel/bladder incontinence, lower extremity weakness, saddle anesthesia.  Denies personal history of malignancy.  Denies previous injury or surgery involving his back.  He has been taking ibuprofen, tramadol, using a brace and KT tape with improvement but not resolution of symptoms.  Patient's blood pressure was noted to be elevated today.  He denies history of hypertension.  He does monitor his blood pressure at home and reports this is generally normal.  He does report that he has been taking ibuprofen regularly.  Denies any chest pain, shortness of breath, headache, vision change, dizziness.  He is followed closely by his primary care provider.    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 Authorizing Provider  lidocaine (LIDODERM) 5 % Place 1 patch onto the skin daily. Remove & Discard patch within 12 hours or as directed by MD 02/27/22  Yes Anahid Eskelson, Junie Panning K, PA-C  methocarbamol (ROBAXIN) 500 MG tablet Take 1 tablet (500 mg total) by mouth 2 (two) times daily. 02/27/22  Yes Masiah Lewing K, PA-C  Insulin Pen Needle 32G X 6 MM MISC 1 each by Does not apply route once a week. 10/30/20   Minette Brine, FNP  OZEMPIC, 0.25 OR 0.5 MG/DOSE, 2 MG/1.5ML SOPN INJECT 0.25 MG  SUBCUTANEOUSLY ONCE WEEKLY 07/01/21   Minette Brine, FNP  glimepiride (AMARYL) 1 MG tablet Take 1 tablet (1 mg total) by mouth daily before breakfast. 03/07/12 05/03/20  Chauncy Passy, MD  metFORMIN (GLUCOPHAGE) 500 MG tablet Take 1 tablet (500 mg total) by mouth at bedtime. 03/07/12 05/03/20  Chauncy Passy, MD    Family History History reviewed. No pertinent family history.  Social History Social History   Tobacco Use   Smoking status: Never   Smokeless tobacco: Never  Vaping Use   Vaping Use: Never used  Substance Use Topics   Alcohol use: No   Drug use: No     Allergies   Patient has no known allergies.   Review of Systems Review of Systems  Constitutional:  Positive for activity change. Negative for appetite change, fatigue and fever.  Respiratory:  Negative  for cough and shortness of breath.   Cardiovascular:  Negative for chest pain.  Gastrointestinal:  Negative for abdominal pain, diarrhea, nausea and vomiting.  Musculoskeletal:  Positive for back pain. Negative for arthralgias and myalgias.  Neurological:  Negative for dizziness, weakness, light-headedness, numbness and headaches.     Physical Exam Triage Vital Signs ED Triage Vitals  Enc Vitals Group     BP 02/27/22 1312 (!) 170/114     Pulse Rate 02/27/22 1312 91     Resp 02/27/22 1312 18     Temp 02/27/22 1312 99.1 F (37.3 C)     Temp Source 02/27/22 1312 Oral     SpO2 02/27/22 1312 97 %     Weight  --      Height --      Head Circumference --      Peak Flow --      Pain Score 02/27/22 1315 3     Pain Loc --      Pain Edu? --      Excl. in [place]? --    No data found.  Updated Vital Signs BP (!) 153/99 (BP Location: Right Arm) Comment (BP Location): large cuff  Pulse 74   Temp 99.1 F (37.3 C) (Oral)   Resp 18   SpO2 97%   Visual Acuity Right Eye Distance:   Left Eye Distance:   Bilateral Distance:    Right Eye Near:   Left Eye Near:    Bilateral Near:     Physical Exam Vitals reviewed.  Constitutional:      General: He is awake.     Appearance: Normal appearance. He is well-developed. He is not ill-appearing.     Comments: Very pleasant male appears stated age in no acute distress sitting comfortably in exam room  HENT:     Head: Normocephalic and atraumatic.     Mouth/Throat:     Pharynx: No oropharyngeal exudate, posterior oropharyngeal erythema or uvula swelling.  Cardiovascular:     Rate and Rhythm: Normal rate and regular rhythm.     Heart sounds: Normal heart sounds, S1 normal and S2 normal. No murmur heard. Pulmonary:     Effort: Pulmonary effort is normal.     Breath sounds: Normal breath sounds. No stridor. No wheezing, rhonchi or rales.     Comments: Clear to auscultation bilaterally Abdominal:     Palpations: Abdomen is soft.     Tenderness: There is no abdominal tenderness.  Musculoskeletal:     Cervical back: No tenderness or bony tenderness.     Thoracic back: No tenderness or bony tenderness.     Lumbar back: Tenderness and bony tenderness present. Negative right straight leg raise test and negative left straight leg raise test.     Comments: Back: Mild pain with percussion of lumbar vertebrae.  No deformity or step-off noted.  Tenderness palpation over left lumbar paraspinal muscles.  Strength 5/5 bilateral upper lower extremities.  Decreased range of motion with forward flexion and extension secondary to pain.  Normal rotation and lateral  flexion.  Neurological:     Mental Status: He is alert.  Psychiatric:        Behavior: Behavior is cooperative.      UC Treatments / Results  Labs (all labs ordered are listed, but only abnormal results are displayed) Labs Reviewed - No data to display  EKG   Radiology DG Lumbar Spine Complete  Result Date: 02/27/2022 CLINICAL DATA:  Pain and swelling after injury. EXAM:  LUMBAR SPINE - COMPLETE 4+ VIEW COMPARISON:  None Available. FINDINGS: Six lumbar type vertebral bodies are identified. No fractures or malalignment. Multilevel degenerative disc disease with anterior osteophytes, particularly in the lower thoracic and upper lumbar spine. No other abnormalities. IMPRESSION: Degenerative changes as above.  No other abnormalities. Electronically Signed   By: Dorise Bullion III M.D.   On: 02/27/2022 13:56    Procedures Procedures (including critical care time)  Medications Ordered in UC Medications - No data to display  Initial Impression / Assessment and Plan / UC Course  I have reviewed the triage vital signs and the nursing notes.  Pertinent labs & imaging results that were available during my care of the patient were reviewed by me and considered in my medical decision making (see chart for details).     Blood pressure is elevated but patient believes this is situational.  He denies any signs/symptoms of endorgan damage that would warrant emergent evaluation.  Recommended he avoid NSAIDs, caffeine, sodium, decongestants.  Recommended close follow-up with his primary care and he is to monitor his blood pressure at home and keep a log for evaluation of follow-up appointment.  If he develops any chest pain, shortness of breath, headache, vision change, dizziness in setting of high blood pressure he needs to go to the emergency room to which he expressed understanding.  X-ray obtained given pain percussion of vertebrae showed degenerative changes with sixth lumbar vertebrae but no  acute findings.  Patient was encouraged to use conservative treatment measures including over-the-counter medications such as Tylenol as well as heat and stretch for symptom relief.  He was prescribed methocarbamol with instruction not to drive or drink alcohol with taking this medication as drowsiness is a common side effect.  He was prescribed lidocaine patches for additional symptom relief.  Discussed that if symptoms or not improving quickly he should follow-up with sports medicine to consider referral for physical therapy and/or more advanced imaging such as MRI.  He was given their contact information with instruction to call to schedule an appointment.  Discussed that if he has any worsening symptoms he needs to be seen immediately.  Strict return precautions given.  Work excuse note provided.  Final Clinical Impressions(s) / UC Diagnoses   Final diagnoses:  Acute left-sided low back pain without sciatica  Elevated blood pressure reading     Discharge Instructions      Your blood pressure is very elevated.  Please monitor this at home and keep a log and follow-up with your primary care provider within a week.  If you develop any chest pain, shortness of breath, headache, vision change, dizziness in setting of high blood pressure you need to go to the emergency room.  Avoid NSAIDs (aspirin, Profen/Advil, naproxen/Aleve), caffeine, sodium, decongestants.  Your x-rays show that you have an extra lumbar vertebrae as well as some arthritis.  There were no other abnormal findings.  Start methocarbamol for pain relief and muscle relaxation.  This will make you sleepy do not drive or drink alcohol taking it.  Use over-the-counter medications including Tylenol for pain relief.  I have also called in a lidocaine patch.  You should place this on your back for 12 hours and then remove it for 12 hours (use only 1 patch per 24 hours).  If your symptoms do not improving please follow-up with orthopedics for  further evaluation and management as you may benefit from more advanced imaging such as an MRI or physical therapy.  Please call  to schedule an appointment.  If anything worsens return immediately.     ED Prescriptions     Medication Sig Dispense Auth. Provider   methocarbamol (ROBAXIN) 500 MG tablet Take 1 tablet (500 mg total) by mouth 2 (two) times daily. 20 tablet Jeaneane Adamec K, PA-C   lidocaine (LIDODERM) 5 % Place 1 patch onto the skin daily. Remove & Discard patch within 12 hours or as directed by MD 30 patch Caliyah Sieh K, PA-C      PDMP not reviewed this encounter.   Terrilee Croak, PA-C 02/27/22 1445

## 2022-02-27 NOTE — Discharge Instructions (Addendum)
Your blood pressure is very elevated.  Please monitor this at home and keep a log and follow-up with your primary care provider within a week.  If you develop any chest pain, shortness of breath, headache, vision change, dizziness in setting of high blood pressure you need to go to the emergency room.  Avoid NSAIDs (aspirin, Profen/Advil, naproxen/Aleve), caffeine, sodium, decongestants.  Your x-rays show that you have an extra lumbar vertebrae as well as some arthritis.  There were no other abnormal findings.  Start methocarbamol for pain relief and muscle relaxation.  This will make you sleepy do not drive or drink alcohol taking it.  Use over-the-counter medications including Tylenol for pain relief.  I have also called in a lidocaine patch.  You should place this on your back for 12 hours and then remove it for 12 hours (use only 1 patch per 24 hours).  If your symptoms do not improving please follow-up with orthopedics for further evaluation and management as you may benefit from more advanced imaging such as an MRI or physical therapy.  Please call to schedule an appointment.  If anything worsens return immediately.

## 2022-02-27 NOTE — ED Triage Notes (Signed)
One week ago was trying to intervene when his 80 pound dog was in an altercation.  Trying to pick up dog felt pain in left lower back radiating into left leg.  Pain is improved, pulling sensation is still present

## 2022-02-28 ENCOUNTER — Encounter: Payer: Self-pay | Admitting: Nurse Practitioner

## 2022-03-02 DIAGNOSIS — M5116 Intervertebral disc disorders with radiculopathy, lumbar region: Secondary | ICD-10-CM | POA: Diagnosis not present

## 2022-03-02 DIAGNOSIS — M9905 Segmental and somatic dysfunction of pelvic region: Secondary | ICD-10-CM | POA: Diagnosis not present

## 2022-03-02 DIAGNOSIS — M9903 Segmental and somatic dysfunction of lumbar region: Secondary | ICD-10-CM | POA: Diagnosis not present

## 2022-03-02 DIAGNOSIS — M25552 Pain in left hip: Secondary | ICD-10-CM | POA: Diagnosis not present

## 2022-03-03 ENCOUNTER — Ambulatory Visit: Payer: BC Managed Care – PPO | Admitting: Nurse Practitioner

## 2022-03-17 DIAGNOSIS — M47816 Spondylosis without myelopathy or radiculopathy, lumbar region: Secondary | ICD-10-CM | POA: Diagnosis not present

## 2022-05-26 ENCOUNTER — Ambulatory Visit: Payer: BC Managed Care – PPO | Admitting: Nurse Practitioner

## 2022-06-02 ENCOUNTER — Encounter: Payer: Self-pay | Admitting: Nurse Practitioner

## 2022-06-02 ENCOUNTER — Ambulatory Visit: Payer: BC Managed Care – PPO | Admitting: Nurse Practitioner

## 2022-06-02 VITALS — BP 130/70 | HR 89 | Temp 98.2°F | Ht 67.8 in | Wt 209.0 lb

## 2022-06-02 DIAGNOSIS — E0843 Diabetes mellitus due to underlying condition with diabetic autonomic (poly)neuropathy: Secondary | ICD-10-CM | POA: Diagnosis not present

## 2022-06-02 DIAGNOSIS — Z2821 Immunization not carried out because of patient refusal: Secondary | ICD-10-CM

## 2022-06-02 DIAGNOSIS — E119 Type 2 diabetes mellitus without complications: Secondary | ICD-10-CM

## 2022-06-02 DIAGNOSIS — D170 Benign lipomatous neoplasm of skin and subcutaneous tissue of head, face and neck: Secondary | ICD-10-CM | POA: Diagnosis not present

## 2022-06-02 DIAGNOSIS — E6609 Other obesity due to excess calories: Secondary | ICD-10-CM

## 2022-06-02 DIAGNOSIS — Z6831 Body mass index (BMI) 31.0-31.9, adult: Secondary | ICD-10-CM

## 2022-06-02 NOTE — Progress Notes (Signed)
I,Tianna Badgett,acting as a Education administrator for [employer], FNP.,have documented all relevant documentation on the behalf of Minette Brine, FNP,as directed by  Minette Brine, FNP while in the presence of Minette Brine, [place].  Subjective:     Patient ID: Tim Tate , male    DOB: [birth year] , 41 y.o.   MRN: [id number]   Chief Complaint  Patient presents with   Diabetes    HPI  The patient is here for a diabetes f/u.  He had to reschedule his eye exam.   Diabetes He presents for his follow-up diabetic visit. He has type 2 diabetes mellitus. There are no hypoglycemic associated symptoms. Pertinent negatives for hypoglycemia include no dizziness or headaches. There are no diabetic associated symptoms. Pertinent negatives for diabetes include no chest pain, no fatigue, no polydipsia, no polyphagia and no polyuria. There are no hypoglycemic complications. There are no diabetic complications. Risk factors for coronary artery disease include sedentary lifestyle and male sex. Current diabetic treatment includes oral agent (triple therapy). He has not had a previous visit with a dietitian. He rarely participates in exercise. (Has not checked his blood sugar has not felt bad. 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 No pertinent family history.   Current Outpatient Medications:    Insulin Pen Needle 32G X 6 MM MISC, 1 each by Does not apply route once a week., Disp: 100 each, Rfl: 2   lidocaine (LIDODERM) 5 %, Place 1 patch onto the skin daily. Remove & Discard patch within 12 hours or as directed by MD, Disp: 30 patch, Rfl: 0   methocarbamol (ROBAXIN) 500 MG tablet, Take 1 tablet (500 mg total) by mouth 2 (two) times daily., Disp: 20 tablet, Rfl: 0   OZEMPIC, 0.25 OR 0.5 MG/DOSE, 2 MG/1.5ML SOPN, INJECT 0.25 MG  SUBCUTANEOUSLY ONCE WEEKLY,  Disp: 3 mL, Rfl: 3   No Known Allergies   Review of Systems  Constitutional: Negative.  Negative for fatigue.  Respiratory: Negative.    Cardiovascular: Negative.  Negative for chest pain, palpitations and leg swelling.  Gastrointestinal: Negative.   Endocrine: Negative for polydipsia, polyphagia and polyuria.  Neurological: Negative.  Negative for dizziness and headaches.  Psychiatric/Behavioral: Negative.       Today's Vitals   06/02/22 1146  BP: 130/70  Pulse: 89  Temp: 98.2 F (36.8 C)  TempSrc: Oral  Weight: 209 lb (94.8 kg)  Height: 5' 7.8" (1.722 m)   Body mass index is 31.97 kg/m.  Wt Readings from Last 3 Encounters:  06/02/22 209 lb (94.8 kg)  02/14/22 191 lb 9.6 oz (86.9 kg)  10/25/21 189 lb (85.7 kg)    Objective:  Physical Exam Vitals reviewed.  Constitutional:      Appearance: Normal appearance.  Cardiovascular:     Rate and Rhythm: Normal rate and regular rhythm.     Pulses: Normal pulses.     Heart sounds: Normal heart sounds. No murmur heard. Pulmonary:     Effort: Pulmonary effort is normal. No respiratory distress.     Breath sounds: Normal breath sounds. No wheezing.  Skin:    General: Skin is warm and dry.     Capillary Refill: Capillary refill takes less than 2 seconds.  Neurological:     General: No focal deficit present.     Mental  Status: He is alert and oriented to person, place, and time.         Assessment And Plan:     1. Diabetes mellitus due to underlying condition with diabetic autonomic neuropathy, unspecified whether long term insulin use ([place]) Comments: He is doing very well, continue Ozempic. Will f/u in 4 months and after his physical if he is still doing well will f/u every 6 months  2. Class 1 obesity due to excess calories with serious comorbidity and body mass index (BMI) of 31.0 to 31.9 in adult He is encouraged to strive for BMI less than 30 to decrease cardiac risk. Advised to aim for at least 150 minutes of  exercise per week  3. Influenza vaccination declined Patient declined influenza vaccination at this time. Patient is aware that influenza vaccine prevents illness in 70% of healthy people, and reduces hospitalizations to 30-70% in elderly. This vaccine is recommended annually. Education has been provided regarding the importance of this vaccine but patient still declined. Advised may receive this vaccine at local pharmacy or Health Dept.or vaccine clinic. Aware to provide a copy of the vaccination record if obtained from local pharmacy or Health Dept.  Pt is willing to accept risk associated with refusing vaccination.  4. COVID-19 vaccination declined Declines covid 19 vaccine. Discussed risk of covid 56 and if he changes her mind about the vaccine to call the office. Education has been provided regarding the importance of this vaccine but patient still declined. Advised may receive this vaccine at local pharmacy or Health Dept.or vaccine clinic. Aware to provide a copy of the vaccination record if obtained from local pharmacy or Health Dept.  Encouraged to take multivitamin, vitamin d, vitamin c and zinc to increase immune system. Aware can call office if would like to have vaccine here at office. Verbalized acceptance and understanding.    Patient was given opportunity to ask questions. Patient verbalized understanding of the plan and was able to repeat key elements of the plan. All questions were answered to their satisfaction.  Minette Brine, FNP   I, Minette Brine, FNP, have reviewed all documentation for this visit. The documentation on 06/02/22 for the exam, diagnosis, procedures, and orders are all accurate and complete.   IF YOU HAVE BEEN REFERRED TO A SPECIALIST, IT MAY TAKE 1-2 WEEKS TO SCHEDULE/PROCESS THE REFERRAL. IF YOU HAVE NOT HEARD FROM US/SPECIALIST IN TWO WEEKS, PLEASE GIVE [place] A CALL AT [phone].   THE PATIENT IS ENCOURAGED TO PRACTICE SOCIAL DISTANCING DUE TO THE COVID-19  PANDEMIC.

## 2022-06-02 NOTE — Patient Instructions (Signed)

## 2022-06-30 LAB — HM DIABETES EYE EXAM

## 2022-07-05 ENCOUNTER — Other Ambulatory Visit: Payer: Self-pay

## 2022-07-05 ENCOUNTER — Telehealth: Payer: Self-pay | Admitting: Nurse Practitioner

## 2022-07-05 DIAGNOSIS — H359 Unspecified retinal disorder: Secondary | ICD-10-CM

## 2022-07-05 NOTE — Telephone Encounter (Signed)
Called patient to inform he was referred to a retina specialist due to his abnormal diabetic eye exam requiring a referral to Retina specialist (Dr. Sherlynn Stalls) within 1 week. Left voicemail to return call to office with any questions.

## 2022-07-11 ENCOUNTER — Ambulatory Visit: Payer: BC Managed Care – PPO | Admitting: Nurse Practitioner

## 2022-07-11 ENCOUNTER — Encounter: Payer: Self-pay | Admitting: Nurse Practitioner

## 2022-07-11 VITALS — BP 142/88 | HR 85 | Temp 98.2°F | Ht 67.8 in | Wt 214.0 lb

## 2022-07-11 DIAGNOSIS — E119 Type 2 diabetes mellitus without complications: Secondary | ICD-10-CM

## 2022-07-11 DIAGNOSIS — Z6832 Body mass index (BMI) 32.0-32.9, adult: Secondary | ICD-10-CM

## 2022-07-11 DIAGNOSIS — I10 Essential (primary) hypertension: Secondary | ICD-10-CM | POA: Diagnosis not present

## 2022-07-11 DIAGNOSIS — E1169 Type 2 diabetes mellitus with other specified complication: Secondary | ICD-10-CM

## 2022-07-11 DIAGNOSIS — E6609 Other obesity due to excess calories: Secondary | ICD-10-CM | POA: Diagnosis not present

## 2022-07-11 MED ORDER — OLMESARTAN MEDOXOMIL 20 MG PO TABS: 20.0000 mg | ORAL_TABLET | Freq: Every day | ORAL | 2 refills | Status: DC

## 2022-07-11 NOTE — Progress Notes (Signed)
I,Tianna Badgett,acting as a Education administrator for [employer], FNP.,have documented all relevant documentation on the behalf of Minette Brine, FNP,as directed by  Minette Brine, FNP while in the presence of Minette Brine, FNP   Subjective:     Patient ID: Tim Tate , male    DOB: 1980-08-14 , 41 y.o.   MRN: [id number]   Chief Complaint  Patient presents with   Hypertension    HPI  Patient presents today for elevated blood pressure readings. He checked his blood pressure on Friday and had slight headache. Blood pressure was 156/93. He used a wrist cuff. The next time he checked it was 240 systolic. Has been checking over the weekend ranging 973-532 systolic. He has a family history or hypertension - mother, maternal grandmother/grandfather.   BP Readings from Last 3 Encounters: 07/11/22 : (!) 142/88 06/02/22 : 130/70 02/27/22 : (!) 153/99    Hypertension This is a new problem. The current episode started more than 1 year ago. The problem is uncontrolled. Pertinent negatives include no anxiety or blurred vision.     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 No pertinent family history.   Current Outpatient Medications:    Insulin Pen Needle 32G X 6 MM MISC, 1 each by Does not apply route once a week., Disp: 100 each, Rfl: 2   lidocaine (LIDODERM) 5 %, Place 1 patch onto the skin daily. Remove & Discard patch within 12 hours or as directed by MD, Disp: 30 patch, Rfl: 0   methocarbamol (ROBAXIN) 500 MG tablet, Take 1 tablet (500 mg total) by mouth 2 (two) times daily., Disp: 20 tablet, Rfl: 0   olmesartan (BENICAR) 20 MG tablet, Take 1 tablet (20 mg total) by mouth daily., Disp: 30 tablet, Rfl: 2   OZEMPIC, 0.25 OR 0.5 MG/DOSE, 2 MG/1.5ML SOPN, INJECT 0.25 MG  SUBCUTANEOUSLY ONCE WEEKLY, Disp: 3 mL, Rfl: 3   No Known Allergies    Review of Systems  Constitutional: Negative.   Eyes:  Negative for blurred vision.  Respiratory: Negative.    Cardiovascular: Negative.   Gastrointestinal: Negative.   Neurological: Negative.      Today's Vitals   07/11/22 1624 07/11/22 1634  BP: (!) 152/92 (!) 142/88  Pulse: 85   Temp: 98.2 F (36.8 C)   TempSrc: Oral   Weight: 214 lb (97.1 kg)   Height: 5' 7.8" (1.722 m)    Body mass index is 32.73 kg/m.  Wt Readings from Last 3 Encounters:  07/11/22 214 lb (97.1 kg)  06/02/22 209 lb (94.8 kg)  02/14/22 191 lb 9.6 oz (86.9 kg)    Objective:  Physical Exam Vitals reviewed.  Constitutional:      Appearance: Normal appearance.  Cardiovascular:     Rate and Rhythm: Normal rate and regular rhythm.     Pulses: Normal pulses.     Heart sounds: Normal heart sounds. No murmur heard. Pulmonary:     Effort: Pulmonary effort is normal. No respiratory distress.     Breath sounds: Normal breath sounds. No wheezing.  Skin:    General: Skin is warm and dry.     Capillary Refill: Capillary refill takes less than 2 seconds.  Neurological:     General: No focal deficit present.     Mental Status: He is alert and oriented to person, place, and  time.         Assessment And Plan:     1. Essential (primary) hypertension Comments: Blood pressure has been elevated more over the weekend, will start olmesartan. Encouraged to limit intake of high salt foods and stay well hydrated with water. EKG done with NSR HR 77. He has an appt with ophthalmology later in the week due to abnormal diabetes eye exam - EKG 12-Lead - BMP8+EGFR  2. Type 2 diabetes mellitus without complication, without long-term current use of insulin (HCC) Comments: HgbA1c has been improving, continue Ozempic 0.25 mg weekly. - Hemoglobin A1c  3. Class 1 obesity due to excess calories with serious comorbidity and body mass index (BMI) of 32.0 to 32.9 in adult She is encouraged to strive for BMI less than 30 to  decrease cardiac risk. Advised to aim for at least 150 minutes of exercise per week.    Patient was given opportunity to ask questions. Patient verbalized understanding of the plan and was able to repeat key elements of the plan. All questions were answered to their satisfaction.  Minette Brine, FNP   I, Minette Brine, FNP, have reviewed all documentation for this visit. The documentation on 07/11/22 for the exam, diagnosis, procedures, and orders are all accurate and complete.   IF YOU HAVE BEEN REFERRED TO A SPECIALIST, IT MAY TAKE 1-2 WEEKS TO SCHEDULE/PROCESS THE REFERRAL. IF YOU HAVE NOT HEARD FROM US/SPECIALIST IN TWO WEEKS, PLEASE GIVE [place] A CALL AT [phone].   THE PATIENT IS ENCOURAGED TO PRACTICE SOCIAL DISTANCING DUE TO THE COVID-19 PANDEMIC.

## 2022-07-11 NOTE — Patient Instructions (Addendum)
Hypertension, Adult High blood pressure (hypertension) is when the force of blood pumping through the arteries is too strong. The arteries are the blood vessels that carry blood from the heart throughout the body. Hypertension forces the heart to work harder to pump blood and may cause arteries to become narrow or stiff. Untreated or uncontrolled hypertension can lead to a heart attack, heart failure, a stroke, kidney disease, and other problems. A blood pressure reading consists of a higher number over a lower number. Ideally, your blood pressure should be below 120/80. The first ("top") number is called the systolic pressure. It is a measure of the pressure in your arteries as your heart beats. The second ("bottom") number is called the diastolic pressure. It is a measure of the pressure in your arteries as the heart relaxes. What are the causes? The exact cause of this condition is not known. There are some conditions that result in high blood pressure. What increases the risk? Certain factors may make you more likely to develop high blood pressure. Some of these risk factors are under your control, including: Smoking. Not getting enough exercise or physical activity. Being overweight. Having too much fat, sugar, calories, or salt (sodium) in your diet. Drinking too much alcohol. Other risk factors include: Having a personal history of heart disease, diabetes, high cholesterol, or kidney disease. Stress. Having a family history of high blood pressure and high cholesterol. Having obstructive sleep apnea. Age. The risk increases with age. What are the signs or symptoms? High blood pressure may not cause symptoms. Very high blood pressure (hypertensive crisis) may cause: Headache. Fast or irregular heartbeats (palpitations). Shortness of breath. Nosebleed. Nausea and vomiting. Vision changes. Severe chest pain, dizziness, and seizures. How is this diagnosed? This condition is diagnosed by  measuring your blood pressure while you are seated, with your arm resting on a flat surface, your legs uncrossed, and your feet flat on the floor. The cuff of the blood pressure monitor will be placed directly against the skin of your upper arm at the level of your heart. Blood pressure should be measured at least twice using the same arm. Certain conditions can cause a difference in blood pressure between your right and left arms. If you have a high blood pressure reading during one visit or you have normal blood pressure with other risk factors, you may be asked to: Return on a different day to have your blood pressure checked again. Monitor your blood pressure at home for 1 week or longer. If you are diagnosed with hypertension, you may have other blood or imaging tests to help your health care provider understand your overall risk for other conditions. How is this treated? This condition is treated by making healthy lifestyle changes, such as eating healthy foods, exercising more, and reducing your alcohol intake. You may be referred for counseling on a healthy diet and physical activity. Your health care provider may prescribe medicine if lifestyle changes are not enough to get your blood pressure under control and if: Your systolic blood pressure is above 130. Your diastolic blood pressure is above 80. Your personal target blood pressure may vary depending on your medical conditions, your age, and other factors. Follow these instructions at home: Eating and drinking  Eat a diet that is high in fiber and potassium, and low in sodium, added sugar, and fat. An example of this eating plan is called the DASH diet. DASH stands for Dietary Approaches to Stop Hypertension. To eat this way: Eat   plenty of fresh fruits and vegetables. Try to fill one half of your plate at each meal with fruits and vegetables. Eat whole grains, such as whole-wheat pasta, brown rice, or whole-grain bread. Fill about one  fourth of your plate with whole grains. Eat or drink low-fat dairy products, such as skim milk or low-fat yogurt. Avoid fatty cuts of meat, processed or cured meats, and poultry with skin. Fill about one fourth of your plate with lean proteins, such as fish, chicken without skin, beans, eggs, or tofu. Avoid pre-made and processed foods. These tend to be higher in sodium, added sugar, and fat. Reduce your daily sodium intake. Many people with hypertension should eat less than 1,500 mg of sodium a day. Do not drink alcohol if: Your health care provider tells you not to drink. You are pregnant, may be pregnant, or are planning to become pregnant. If you drink alcohol: Limit how much you have to: 0-1 drink a day for women. 0-2 drinks a day for men. Know how much alcohol is in your drink. In the [place] one drink equals one 12 oz bottle of beer (355 mL), one 5 oz glass of wine (148 mL), or one 1 oz glass of hard liquor (44 mL). Lifestyle  Work with your health care provider to maintain a healthy body weight or to lose weight. Ask what an ideal weight is for you. Get at least 30 minutes of exercise that causes your heart to beat faster (aerobic exercise) most days of the week. Activities may include walking, swimming, or biking. Include exercise to strengthen your muscles (resistance exercise), such as Pilates or lifting weights, as part of your weekly exercise routine. Try to do these types of exercises for 30 minutes at least 3 days a week. Do not use any products that contain nicotine or tobacco. These products include cigarettes, chewing tobacco, and vaping devices, such as e-cigarettes. If you need help quitting, ask your health care provider. Monitor your blood pressure at home as told by your health care provider. Keep all follow-up visits. This is important. Medicines Take over-the-counter and prescription medicines only as told by your health care provider. Follow directions carefully. Blood  pressure medicines must be taken as prescribed. Do not skip doses of blood pressure medicine. Doing this puts you at risk for problems and can make the medicine less effective. Ask your health care provider about side effects or reactions to medicines that you should watch for. Contact a health care provider if you: Think you are having a reaction to a medicine you are taking. Have headaches that keep coming back (recurring). Feel dizzy. Have swelling in your ankles. Have trouble with your vision. Get help right away if you: Develop a severe headache or confusion. Have unusual weakness or numbness. Feel faint. Have severe pain in your chest or abdomen. Vomit repeatedly. Have trouble breathing. These symptoms may be an emergency. Get help right away. Call 911. Do not wait to see if the symptoms will go away. Do not drive yourself to the hospital. Summary Hypertension is when the force of blood pumping through your arteries is too strong. If this condition is not controlled, it may put you at risk for serious complications. Your personal target blood pressure may vary depending on your medical conditions, your age, and other factors. For most people, a normal blood pressure is less than 120/80. Hypertension is treated with lifestyle changes, medicines, or a combination of both. Lifestyle changes include losing weight, eating a healthy,   low-sodium diet, exercising more, and limiting alcohol. This information is not intended to replace advice given to you by your health care provider. Make sure you discuss any questions you have with your health care provider. Document Revised: 05/25/2021 Document Reviewed: 05/25/2021 Elsevier Patient Education  [place].  Low-Sodium Eating Plan Sodium, which is an element that makes up salt, helps you maintain a healthy balance of fluids in your body. Too much sodium can increase your blood pressure and cause fluid and waste to be held in your  body. Your health care provider or dietitian may recommend following this plan if you have high blood pressure (hypertension), kidney disease, liver disease, or heart failure. Eating less sodium can help lower your blood pressure, reduce swelling, and protect your heart, liver, and kidneys. What are tips for following this plan? Reading food labels The Nutrition Facts label lists the amount of sodium in one serving of the food. If you eat more than one serving, you must multiply the listed amount of sodium by the number of servings. Choose foods with less than 140 mg of sodium per serving. Avoid foods with 300 mg of sodium or more per serving. Shopping  Look for lower-sodium products, often labeled as "low-sodium" or "no salt added." Always check the sodium content, even if foods are labeled as "unsalted" or "no salt added." Buy fresh foods. Avoid canned foods and pre-made or frozen meals. Avoid canned, cured, or processed meats. Buy breads that have less than 80 mg of sodium per slice. Cooking  Eat more home-cooked food and less restaurant, buffet, and fast food. Avoid adding salt when cooking. Use salt-free seasonings or herbs instead of table salt or sea salt. Check with your health care provider or pharmacist before using salt substitutes. Cook with plant-based oils, such as canola, sunflower, or olive oil. Meal planning When eating at a restaurant, ask that your food be prepared with less salt or no salt, if possible. Avoid dishes labeled as brined, pickled, cured, smoked, or made with soy sauce, miso, or teriyaki sauce. Avoid foods that contain MSG (monosodium glutamate). MSG is sometimes added to [place] food, bouillon, and some canned foods. Make meals that can be grilled, baked, poached, roasted, or steamed. These are generally made with less sodium. General information Most people on this plan should limit their sodium intake to 1,500-2,000 mg (milligrams) of sodium each day. What  foods should I eat? Fruits Fresh, frozen, or canned fruit. Fruit juice. Vegetables Fresh or frozen vegetables. "No salt added" canned vegetables. "No salt added" tomato sauce and paste. Low-sodium or reduced-sodium tomato and vegetable juice. Grains Low-sodium cereals, including oats, puffed wheat and rice, and shredded wheat. Low-sodium crackers. Unsalted rice. Unsalted pasta. Low-sodium bread. Whole-grain breads and whole-grain pasta. Meats and other proteins Fresh or frozen (no salt added) meat, poultry, seafood, and fish. Low-sodium canned tuna and salmon. Unsalted nuts. Dried peas, beans, and lentils without added salt. Unsalted canned beans. Eggs. Unsalted nut butters. Dairy Milk. Soy milk. Cheese that is naturally low in sodium, such as ricotta cheese, fresh mozzarella, or Swiss cheese. Low-sodium or reduced-sodium cheese. Cream cheese. Yogurt. Seasonings and condiments Fresh and dried herbs and spices. Salt-free seasonings. Low-sodium mustard and ketchup. Sodium-free salad dressing. Sodium-free light mayonnaise. Fresh or refrigerated horseradish. Lemon juice. Vinegar. Other foods Homemade, reduced-sodium, or low-sodium soups. Unsalted popcorn and pretzels. Low-salt or salt-free chips. The items listed above may not be a complete list of foods and beverages you can eat. Contact a dietitian  for more information. What foods should I avoid? Vegetables Sauerkraut, pickled vegetables, and relishes. Olives. [place] fries. Onion rings. Regular canned vegetables (not low-sodium or reduced-sodium). Regular canned tomato sauce and paste (not low-sodium or reduced-sodium). Regular tomato and vegetable juice (not low-sodium or reduced-sodium). Frozen vegetables in sauces. Grains Instant hot cereals. Bread stuffing, pancake, and biscuit mixes. Croutons. Seasoned rice or pasta mixes. Noodle soup cups. Boxed or frozen macaroni and cheese. Regular salted crackers. Self-rising flour. Meats and other  proteins Meat or fish that is salted, canned, smoked, spiced, or pickled. Precooked or cured meat, such as sausages or meat loaves. Berniece Salines. Ham. Pepperoni. Hot dogs. Corned beef. Chipped beef. Salt pork. Jerky. Pickled herring. Anchovies and sardines. Regular canned tuna. Salted nuts. Dairy Processed cheese and cheese spreads. Hard cheeses. Cheese curds. Blue cheese. Feta cheese. String cheese. Regular cottage cheese. Buttermilk. Canned milk. Fats and oils Salted butter. Regular margarine. Ghee. Bacon fat. Seasonings and condiments Onion salt, garlic salt, seasoned salt, table salt, and sea salt. Canned and packaged gravies. Worcestershire sauce. Tartar sauce. Barbecue sauce. Teriyaki sauce. Soy sauce, including reduced-sodium. Steak sauce. Fish sauce. Oyster sauce. Cocktail sauce. Horseradish that you find on the shelf. Regular ketchup and mustard. Meat flavorings and tenderizers. Bouillon cubes. Hot sauce. Pre-made or packaged marinades. Pre-made or packaged taco seasonings. Relishes. Regular salad dressings. Salsa. Other foods Salted popcorn and pretzels. Corn chips and puffs. Potato and tortilla chips. Canned or dried soups. Pizza. Frozen entrees and pot pies. The items listed above may not be a complete list of foods and beverages you should avoid. Contact a dietitian for more information. Summary Eating less sodium can help lower your blood pressure, reduce swelling, and protect your heart, liver, and kidneys. Most people on this plan should limit their sodium intake to 1,500-2,000 mg (milligrams) of sodium each day. Canned, boxed, and frozen foods are high in sodium. Restaurant foods, fast foods, and pizza are also very high in sodium. You also get sodium by adding salt to food. Try to cook at home, eat more fresh fruits and vegetables, and eat less fast food and canned, processed, or prepared foods. This information is not intended to replace advice given to you by your health care provider.  Make sure you discuss any questions you have with your health care provider. Document Revised: 08/23/2019 Document Reviewed: 06/19/2019 Elsevier Patient Education  [place].

## 2022-07-12 ENCOUNTER — Other Ambulatory Visit: Payer: Self-pay

## 2022-07-12 DIAGNOSIS — I10 Essential (primary) hypertension: Secondary | ICD-10-CM

## 2022-07-12 LAB — BMP8+EGFR
BUN/Creatinine Ratio: 12 (ref 9–20)
BUN: 12 mg/dL (ref 6–24)
CO2: 27 mmol/L (ref 20–29)
Calcium: 10.2 mg/dL (ref 8.7–10.2)
Chloride: 104 mmol/L (ref 96–106)
Creatinine, Ser: 1.03 mg/dL (ref 0.76–1.27)
Glucose: 125 mg/dL — ABNORMAL HIGH (ref 70–99)
Potassium: 4.7 mmol/L (ref 3.5–5.2)
Sodium: 145 mmol/L — ABNORMAL HIGH (ref 134–144)
eGFR: 93 mL/min/{1.73_m2} (ref >59)

## 2022-07-12 LAB — HEMOGLOBIN A1C
Est. average glucose Bld gHb Est-mCnc: 126 mg/dL
Hgb A1c MFr Bld: 6 % — ABNORMAL HIGH (ref 4.8–5.6)

## 2022-07-12 MED ORDER — OLMESARTAN MEDOXOMIL 20 MG PO TABS: 20.0000 mg | ORAL_TABLET | Freq: Every day | ORAL | 2 refills | Status: DC

## 2022-07-27 ENCOUNTER — Other Ambulatory Visit: Payer: Self-pay

## 2022-07-27 DIAGNOSIS — I10 Essential (primary) hypertension: Secondary | ICD-10-CM

## 2022-07-28 ENCOUNTER — Other Ambulatory Visit: Payer: Self-pay

## 2022-07-28 ENCOUNTER — Ambulatory Visit: Payer: BC Managed Care – PPO

## 2022-07-28 DIAGNOSIS — E119 Type 2 diabetes mellitus without complications: Secondary | ICD-10-CM

## 2022-07-28 MED ORDER — SEMAGLUTIDE(0.25 OR 0.5MG/DOS) 2 MG/3ML ~~LOC~~ SOPN: 0.5000 mg | PEN_INJECTOR | SUBCUTANEOUS | 1 refills | Status: DC

## 2022-08-02 ENCOUNTER — Ambulatory Visit: Payer: BC Managed Care – PPO

## 2022-08-02 ENCOUNTER — Other Ambulatory Visit: Payer: Self-pay

## 2022-08-02 VITALS — BP 138/82 | HR 86 | Temp 98.2°F

## 2022-08-02 DIAGNOSIS — I10 Essential (primary) hypertension: Secondary | ICD-10-CM

## 2022-08-02 MED ORDER — AMLODIPINE BESYLATE 2.5 MG PO TABS: 2.5000 mg | ORAL_TABLET | Freq: Every day | ORAL | 1 refills | Status: DC

## 2022-08-02 NOTE — Progress Notes (Signed)
Patient presents today for BPC. He is currently on Olmesartan '20mg'$ .   BP Readings from Last 3 Encounters:  08/02/22 (!) 142/88  07/11/22 (!) 142/88  06/02/22 130/70   Provider would like to add Amlodipine 2.'5mg'$ . pt will return in 2 weeks for nurse visit.

## 2022-08-02 NOTE — Patient Instructions (Signed)
Hypertension, Adult ?Hypertension is another name for high blood pressure. High blood pressure forces your heart to work harder to pump blood. This can cause problems over time. ?There are two numbers in a blood pressure reading. There is a top number (systolic) over a bottom number (diastolic). It is best to have a blood pressure that is below 120/80. ?What are the causes? ?The cause of this condition is not known. Some other conditions can lead to high blood pressure. ?What increases the risk? ?Some lifestyle factors can make you more likely to develop high blood pressure: ?Smoking. ?Not getting enough exercise or physical activity. ?Being overweight. ?Having too much fat, sugar, calories, or salt (sodium) in your diet. ?Drinking too much alcohol. ?Other risk factors include: ?Having any of these conditions: ?Heart disease. ?Diabetes. ?High cholesterol. ?Kidney disease. ?Obstructive sleep apnea. ?Having a family history of high blood pressure and high cholesterol. ?Age. The risk increases with age. ?Stress. ?What are the signs or symptoms? ?High blood pressure may not cause symptoms. Very high blood pressure (hypertensive crisis) may cause: ?Headache. ?Fast or uneven heartbeats (palpitations). ?Shortness of breath. ?Nosebleed. ?Vomiting or feeling like you may vomit (nauseous). ?Changes in how you see. ?Very bad chest pain. ?Feeling dizzy. ?Seizures. ?How is this treated? ?This condition is treated by making healthy lifestyle changes, such as: ?Eating healthy foods. ?Exercising more. ?Drinking less alcohol. ?Your doctor may prescribe medicine if lifestyle changes do not help enough and if: ?Your top number is above 130. ?Your bottom number is above 80. ?Your personal target blood pressure may vary. ?Follow these instructions at home: ?Eating and drinking ? ?If told, follow the DASH eating plan. To follow this plan: ?Fill one half of your plate at each meal with fruits and vegetables. ?Fill one fourth of your plate  at each meal with whole grains. Whole grains include whole-wheat pasta, brown rice, and whole-grain bread. ?Eat or drink low-fat dairy products, such as skim milk or low-fat yogurt. ?Fill one fourth of your plate at each meal with low-fat (lean) proteins. Low-fat proteins include fish, chicken without skin, eggs, beans, and tofu. ?Avoid fatty meat, cured and processed meat, or chicken with skin. ?Avoid pre-made or processed food. ?Limit the amount of salt in your diet to less than 1,500 mg each day. ?Do not drink alcohol if: ?Your doctor tells you not to drink. ?You are pregnant, may be pregnant, or are planning to become pregnant. ?If you drink alcohol: ?Limit how much you have to: ?0-1 drink a day for women. ?0-2 drinks a day for men. ?Know how much alcohol is in your drink. In the [place] one drink equals one 12 oz bottle of beer (355 mL), one 5 oz glass of wine (148 mL), or one 1? oz glass of hard liquor (44 mL). ?Lifestyle ? ?Work with your doctor to stay at a healthy weight or to lose weight. Ask your doctor what the best weight is for you. ?Get at least 30 minutes of exercise that causes your heart to beat faster (aerobic exercise) most days of the week. This may include walking, swimming, or biking. ?Get at least 30 minutes of exercise that strengthens your muscles (resistance exercise) at least 3 days a week. This may include lifting weights or doing Pilates. ?Do not smoke or use any products that contain nicotine or tobacco. If you need help quitting, ask your doctor. ?Check your blood pressure at home as told by your doctor. ?Keep all follow-up visits. ?Medicines ?Take over-the-counter and prescription medicines   only as told by your doctor. Follow directions carefully. ?Do not skip doses of blood pressure medicine. The medicine does not work as well if you skip doses. Skipping doses also puts you at risk for problems. ?Ask your doctor about side effects or reactions to medicines that you should watch  for. ?Contact a doctor if: ?You think you are having a reaction to the medicine you are taking. ?You have headaches that keep coming back. ?You feel dizzy. ?You have swelling in your ankles. ?You have trouble with your vision. ?Get help right away if: ?You get a very bad headache. ?You start to feel mixed up (confused). ?You feel weak or numb. ?You feel faint. ?You have very bad pain in your: ?Chest. ?Belly (abdomen). ?You vomit more than once. ?You have trouble breathing. ?These symptoms may be an emergency. Get help right away. Call 911. ?Do not wait to see if the symptoms will go away. ?Do not drive yourself to the hospital. ?Summary ?Hypertension is another name for high blood pressure. ?High blood pressure forces your heart to work harder to pump blood. ?For most people, a normal blood pressure is less than 120/80. ?Making healthy choices can help lower blood pressure. If your blood pressure does not get lower with healthy choices, you may need to take medicine. ?This information is not intended to replace advice given to you by your health care provider. Make sure you discuss any questions you have with your health care provider. ?Document Revised: 05/06/2021 Document Reviewed: 05/06/2021 ?Elsevier Patient Education ? 2023 Elsevier Inc. ? ?

## 2022-08-03 LAB — BMP8+EGFR
BUN/Creatinine Ratio: 8 — ABNORMAL LOW (ref 9–20)
BUN: 9 mg/dL (ref 6–24)
CO2: 28 mmol/L (ref 20–29)
Calcium: 9.3 mg/dL (ref 8.7–10.2)
Chloride: 109 mmol/L — ABNORMAL HIGH (ref 96–106)
Creatinine, Ser: 1.19 mg/dL (ref 0.76–1.27)
Glucose: 127 mg/dL — ABNORMAL HIGH (ref 70–99)
Potassium: 4.6 mmol/L (ref 3.5–5.2)
Sodium: 147 mmol/L — ABNORMAL HIGH (ref 134–144)
eGFR: 78 mL/min/{1.73_m2} (ref >59)

## 2022-08-04 ENCOUNTER — Encounter (INDEPENDENT_AMBULATORY_CARE_PROVIDER_SITE_OTHER): Payer: Managed Care, Other (non HMO) | Admitting: Ophthalmology

## 2022-08-04 ENCOUNTER — Encounter: Payer: Self-pay | Admitting: Nurse Practitioner

## 2022-08-04 DIAGNOSIS — H35033 Hypertensive retinopathy, bilateral: Secondary | ICD-10-CM

## 2022-08-04 DIAGNOSIS — I10 Essential (primary) hypertension: Secondary | ICD-10-CM

## 2022-08-04 DIAGNOSIS — H43813 Vitreous degeneration, bilateral: Secondary | ICD-10-CM

## 2022-08-04 DIAGNOSIS — E113393 Type 2 diabetes mellitus with moderate nonproliferative diabetic retinopathy without macular edema, bilateral: Secondary | ICD-10-CM

## 2022-08-04 LAB — HM DIABETES EYE EXAM

## 2022-08-16 ENCOUNTER — Ambulatory Visit: Payer: BC Managed Care – PPO

## 2022-08-18 ENCOUNTER — Ambulatory Visit: Payer: BC Managed Care – PPO

## 2022-09-15 ENCOUNTER — Ambulatory Visit
Admission: EM | Admit: 2022-09-15 | Discharge: 2022-09-15 | Disposition: A | Discharge status: Home / Self Care | Payer: Managed Care, Other (non HMO) | Attending: Internal Medicine | Admitting: Internal Medicine

## 2022-09-15 ENCOUNTER — Encounter: Payer: Self-pay | Admitting: Emergency Medicine

## 2022-09-15 ENCOUNTER — Other Ambulatory Visit: Payer: Self-pay

## 2022-09-15 DIAGNOSIS — M542 Cervicalgia: Secondary | ICD-10-CM

## 2022-09-15 DIAGNOSIS — M546 Pain in thoracic spine: Secondary | ICD-10-CM | POA: Diagnosis not present

## 2022-09-15 HISTORY — DX: Essential (primary) hypertension: I10

## 2022-09-15 MED ORDER — METHOCARBAMOL 500 MG PO TABS: 500.0000 mg | ORAL_TABLET | Freq: Two times a day (BID) | ORAL | 0 refills | Status: DC | PRN
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
## 2022-09-19 DIAGNOSIS — M25552 Pain in left hip: Secondary | ICD-10-CM | POA: Insufficient documentation

## 2022-09-19 DIAGNOSIS — M542 Cervicalgia: Secondary | ICD-10-CM | POA: Insufficient documentation

## 2022-10-01 DIAGNOSIS — S46811A Strain of other muscles, fascia and tendons at shoulder and upper arm level, right arm, initial encounter: Secondary | ICD-10-CM | POA: Insufficient documentation

## 2022-10-01 DIAGNOSIS — M62838 Other muscle spasm: Secondary | ICD-10-CM | POA: Insufficient documentation

## 2022-10-03 ENCOUNTER — Other Ambulatory Visit: Payer: Self-pay | Admitting: Nurse Practitioner

## 2022-10-03 DIAGNOSIS — I10 Essential (primary) hypertension: Secondary | ICD-10-CM

## 2022-10-05 ENCOUNTER — Encounter: Payer: Self-pay | Admitting: Nurse Practitioner

## 2022-10-05 ENCOUNTER — Ambulatory Visit: Payer: Managed Care, Other (non HMO) | Admitting: Nurse Practitioner

## 2022-10-05 VITALS — BP 136/68 | HR 86 | Temp 98.1°F | Ht 67.0 in | Wt 208.0 lb

## 2022-10-05 DIAGNOSIS — I10 Essential (primary) hypertension: Secondary | ICD-10-CM | POA: Diagnosis not present

## 2022-10-05 DIAGNOSIS — M545 Low back pain, unspecified: Secondary | ICD-10-CM

## 2022-10-05 DIAGNOSIS — R051 Acute cough: Secondary | ICD-10-CM

## 2022-10-05 DIAGNOSIS — Z2821 Immunization not carried out because of patient refusal: Secondary | ICD-10-CM

## 2022-10-05 DIAGNOSIS — E119 Type 2 diabetes mellitus without complications: Secondary | ICD-10-CM | POA: Diagnosis not present

## 2022-10-05 MED ORDER — SEMAGLUTIDE(0.25 OR 0.5MG/DOS) 2 MG/3ML ~~LOC~~ SOPN: 0.5000 mg | PEN_INJECTOR | SUBCUTANEOUS | 1 refills | Status: DC

## 2022-10-05 MED ORDER — ALBUTEROL SULFATE HFA 108 (90 BASE) MCG/ACT IN AERS: 2.0000 | INHALATION_SPRAY | Freq: Four times a day (QID) | RESPIRATORY_TRACT | 2 refills | Status: DC | PRN

## 2022-10-05 MED ORDER — PREDNISONE 20 MG PO TABS: 20.0000 mg | ORAL_TABLET | Freq: Every day | ORAL | 0 refills | Status: DC

## 2022-10-05 NOTE — Patient Instructions (Signed)
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
## 2022-10-06 LAB — HEMOGLOBIN A1C
Est. average glucose Bld gHb Est-mCnc: 200 mg/dL
Hgb A1c MFr Bld: 8.6 % — ABNORMAL HIGH (ref 4.8–5.6)

## 2022-10-16 MED ORDER — BLOOD GLUCOSE MONITOR KIT: PACK | 0 refills | Status: AC

## 2022-10-16 MED ORDER — BLOOD GLUCOSE MONITOR KIT: PACK | 0 refills | Status: DC

## 2022-10-16 MED ORDER — OZEMPIC (1 MG/DOSE) 4 MG/3ML ~~LOC~~ SOPN: 1.0000 mg | PEN_INJECTOR | SUBCUTANEOUS | 1 refills | Status: DC

## 2022-10-31 ENCOUNTER — Other Ambulatory Visit: Payer: Self-pay

## 2022-10-31 DIAGNOSIS — E119 Type 2 diabetes mellitus without complications: Secondary | ICD-10-CM

## 2022-11-14 ENCOUNTER — Encounter: Payer: Self-pay | Admitting: Nurse Practitioner

## 2022-11-14 ENCOUNTER — Other Ambulatory Visit: Payer: Self-pay

## 2022-11-14 DIAGNOSIS — E119 Type 2 diabetes mellitus without complications: Secondary | ICD-10-CM

## 2022-11-14 MED ORDER — OZEMPIC (1 MG/DOSE) 4 MG/3ML ~~LOC~~ SOPN: 1.0000 mg | PEN_INJECTOR | SUBCUTANEOUS | 1 refills | Status: DC

## 2022-12-14 ENCOUNTER — Other Ambulatory Visit: Payer: Self-pay

## 2022-12-14 DIAGNOSIS — E119 Type 2 diabetes mellitus without complications: Secondary | ICD-10-CM

## 2022-12-14 MED ORDER — OZEMPIC (1 MG/DOSE) 4 MG/3ML ~~LOC~~ SOPN: 1.0000 mg | PEN_INJECTOR | SUBCUTANEOUS | 1 refills | Status: DC

## 2023-01-09 ENCOUNTER — Other Ambulatory Visit: Payer: Self-pay

## 2023-01-09 DIAGNOSIS — E119 Type 2 diabetes mellitus without complications: Secondary | ICD-10-CM

## 2023-01-09 MED ORDER — OZEMPIC (1 MG/DOSE) 4 MG/3ML ~~LOC~~ SOPN: 1.0000 mg | PEN_INJECTOR | SUBCUTANEOUS | 1 refills | Status: DC

## 2023-02-13 ENCOUNTER — Encounter (INDEPENDENT_AMBULATORY_CARE_PROVIDER_SITE_OTHER): Payer: Managed Care, Other (non HMO) | Admitting: Ophthalmology

## 2023-02-20 ENCOUNTER — Other Ambulatory Visit: Payer: Self-pay | Admitting: Nurse Practitioner

## 2023-02-20 ENCOUNTER — Encounter: Payer: Self-pay | Admitting: Nurse Practitioner

## 2023-02-20 ENCOUNTER — Ambulatory Visit (INDEPENDENT_AMBULATORY_CARE_PROVIDER_SITE_OTHER): Payer: Managed Care, Other (non HMO) | Admitting: Nurse Practitioner

## 2023-02-20 VITALS — BP 120/80 | HR 82 | Temp 98.4°F | Ht 67.0 in | Wt 185.0 lb

## 2023-02-20 DIAGNOSIS — F5101 Primary insomnia: Secondary | ICD-10-CM | POA: Diagnosis not present

## 2023-02-20 DIAGNOSIS — E663 Overweight: Secondary | ICD-10-CM

## 2023-02-20 DIAGNOSIS — I10 Essential (primary) hypertension: Secondary | ICD-10-CM

## 2023-02-20 DIAGNOSIS — E113293 Type 2 diabetes mellitus with mild nonproliferative diabetic retinopathy without macular edema, bilateral: Secondary | ICD-10-CM | POA: Diagnosis not present

## 2023-02-20 DIAGNOSIS — Z6828 Body mass index (BMI) 28.0-28.9, adult: Secondary | ICD-10-CM

## 2023-02-20 DIAGNOSIS — Z2821 Immunization not carried out because of patient refusal: Secondary | ICD-10-CM

## 2023-02-20 MED ORDER — OLMESARTAN MEDOXOMIL 20 MG PO TABS: 20.0000 mg | ORAL_TABLET | Freq: Every day | ORAL | 2 refills | Status: DC

## 2023-02-20 MED ORDER — OZEMPIC (1 MG/DOSE) 4 MG/3ML ~~LOC~~ SOPN: 1.0000 mg | PEN_INJECTOR | SUBCUTANEOUS | 1 refills | Status: DC

## 2023-02-20 MED ORDER — AMLODIPINE BESYLATE 2.5 MG PO TABS: 2.5000 mg | ORAL_TABLET | Freq: Every day | ORAL | 1 refills | Status: DC

## 2023-02-20 NOTE — Assessment & Plan Note (Signed)
Hemoglobin A1c was up at last visit, increase Ozempic to 1 mg weekly.  Will recheck hemoglobin A1c today.

## 2023-02-20 NOTE — Assessment & Plan Note (Signed)
Hemoglobin A1c was up at last visit, increase Ozempic to 1 mg weekly. Will recheck hemoglobin A1c today. Continue f/u with Retina specialist
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
## 2023-02-20 NOTE — Assessment & Plan Note (Signed)
Declines any medications, encouraged to focus on sleep hygiene.

## 2023-02-21 LAB — MICROALBUMIN / CREATININE URINE RATIO
Creatinine, Urine: 415.1 mg/dL
Microalb/Creat Ratio: 7 mg/g creat (ref 0–29)
Microalbumin, Urine: 27.5 ug/mL

## 2023-02-21 LAB — BASIC METABOLIC PANEL
BUN/Creatinine Ratio: 10 (ref 9–20)
BUN: 10 mg/dL (ref 6–24)
CO2: 26 mmol/L (ref 20–29)
Calcium: 9.6 mg/dL (ref 8.7–10.2)
Chloride: 105 mmol/L (ref 96–106)
Creatinine, Ser: 1.03 mg/dL (ref 0.76–1.27)
Glucose: 85 mg/dL (ref 70–99)
Potassium: 4.7 mmol/L (ref 3.5–5.2)
Sodium: 144 mmol/L (ref 134–144)
eGFR: 92 mL/min/{1.73_m2} (ref >59)

## 2023-02-21 LAB — HEMOGLOBIN A1C
Est. average glucose Bld gHb Est-mCnc: 120 mg/dL
Hgb A1c MFr Bld: 5.8 % — ABNORMAL HIGH (ref 4.8–5.6)

## 2023-02-21 LAB — LIPID PANEL
Chol/HDL Ratio: 3.2 ratio (ref 0.0–5.0)
Cholesterol, Total: 139 mg/dL (ref 100–199)
HDL: 43 mg/dL (ref >39)
LDL Chol Calc (NIH): 85 mg/dL (ref 0–99)
Triglycerides: 52 mg/dL (ref 0–149)
VLDL Cholesterol Cal: 11 mg/dL (ref 5–40)

## 2023-03-01 ENCOUNTER — Encounter: Payer: BC Managed Care – PPO | Admitting: Nurse Practitioner

## 2023-06-08 ENCOUNTER — Encounter: Payer: BC Managed Care – PPO | Admitting: Nurse Practitioner

## 2023-06-08 ENCOUNTER — Encounter: Payer: BC Managed Care – PPO | Admitting: Family Medicine

## 2023-06-13 NOTE — Progress Notes (Signed)
Madelaine Bhat, CMA,acting as a Neurosurgeon for [name], FNP.,have documented all relevant documentation on the behalf of [name], FNP,as directed by  [name], FNP while in the presence of [name], FNP.  Subjective:   Patient ID: Tim Tate , male    DOB: 22-Aug-1980 , 42 y.o.   MRN: [id number]  Chief Complaint  Patient presents with   Annual Exam    HPI  Patient presents today for HM, Patient reports compliance with medication. Patient denies any chest pain, SOB, or headaches. Patient has no concerns today.   Diabetes He presents for his follow-up diabetic visit. He has type 2 diabetes mellitus. There are no hypoglycemic associated symptoms. Pertinent negatives for hypoglycemia include no dizziness or headaches. There are no diabetic associated symptoms. Pertinent negatives for diabetes include no chest pain, no fatigue, no polydipsia, no polyphagia and no polyuria. There are no hypoglycemic complications. Diabetic complications include retinopathy. Risk factors for coronary artery disease include sedentary lifestyle and male sex. Current diabetic treatment includes oral agent (triple therapy). He is following a generally healthy diet. When asked about meal planning, he reported none. He has not had a previous visit with a dietitian. He participates in exercise intermittently (2-3 times a week). His home blood glucose trend is decreasing steadily. (Blood sugars have not been over 200 he thinks the highest is 160) An ACE inhibitor/angiotensin II receptor blocker is being taken. He does not see a podiatrist.Eye exam is current.     Past Medical History:  Diagnosis Date   Diabetes mellitus without complication (HCC)    Type 2   Hypertension    Mild nonproliferative retinopathy due to secondary diabetes (HCC) 02/22/2022   Severe hyperglycemia due to diabetes mellitus (HCC) 06/01/2017   Diabetic severe hyperglycemia (disorder)       History reviewed. No pertinent family  history.   Current Outpatient Medications:    blood glucose meter kit and supplies KIT, Dispense based on patient and insurance preference. Use up to four times daily as directed., Disp: 1 each, Rfl: 0   Insulin Pen Needle 32G X 6 MM MISC, 1 each by Does not apply route once a week., Disp: 100 each, Rfl: 2   Semaglutide, 1 MG/DOSE, (OZEMPIC, 1 MG/DOSE,) 4 MG/3ML SOPN, Inject 1 mg into the skin once a week., Disp: 9 mL, Rfl: 1   amLODipine (NORVASC) 2.5 MG tablet, Take 1 tablet (2.5 mg total) by mouth daily., Disp: 90 tablet, Rfl: 1   olmesartan (BENICAR) 20 MG tablet, Take 1 tablet (20 mg total) by mouth daily., Disp: 90 tablet, Rfl: 2   Allergies  Allergen Reactions   Coconut (Cocos Nucifera) Anaphylaxis   Strawberry Flavor Anaphylaxis     Men's preventive visit. Patient Health Questionnaire (PHQ-2) is  Flowsheet Row Office Visit from 06/14/2023 in [facility]  PHQ-2 Total Score 0     Patient is on a Regular diet; reports he has improved his diet. Exercising 2-3 days a week, 30 minutes each time. Marital status: Single. Relevant history for alcohol use is:  Social History   Substance and Sexual Activity  Alcohol Use No  Relevant history for tobacco use is:  Social History   Tobacco Use  Smoking Status Never  Smokeless Tobacco Never  .   Review of Systems  Constitutional: Negative.  Negative for fatigue.  HENT: Negative.    Eyes: Negative.   Respiratory:  Positive for cough (intermittent cough dry).   Cardiovascular: Negative.  Negative for chest pain.  Gastrointestinal: Negative.   Endocrine: Negative.  Negative for polydipsia, polyphagia and polyuria.  Genitourinary: Negative.   Musculoskeletal: Negative.   Skin: Negative.   Allergic/Immunologic: Negative.   Neurological:  Negative for dizziness and headaches.  Hematological: Negative.   Psychiatric/Behavioral: Negative.       Today's Vitals   06/14/23 0853  BP: 126/82  Pulse:  86  Temp: 98.2 F (36.8 C)  TempSrc: Oral  Weight: 186 lb 12.8 oz (84.7 kg)  Height: 5\' 7"  (1.702 m)  PainSc: 0-No pain   Body mass index is 29.26 kg/m.  Wt Readings from Last 3 Encounters:  06/14/23 186 lb 12.8 oz (84.7 kg)  02/20/23 185 lb (83.9 kg)  10/05/22 208 lb (94.3 kg)    Objective:  Physical Exam Vitals reviewed.  Constitutional:      General: He is not in acute distress.    Appearance: Normal appearance.  HENT:     Head: Normocephalic and atraumatic.     Right Ear: Tympanic membrane, ear canal and external ear normal. There is no impacted cerumen.     Left Ear: Tympanic membrane, ear canal and external ear normal. There is no impacted cerumen.     Nose: Nose normal.     Mouth/Throat:     Mouth: Mucous membranes are moist.     Comments: He has several posterior teeth upper that are embedded.  Eyes:     Extraocular Movements: Extraocular movements intact.     Conjunctiva/sclera: Conjunctivae normal.     Pupils: Pupils are equal, round, and reactive to light.  Cardiovascular:     Rate and Rhythm: Normal rate and regular rhythm.     Pulses: Normal pulses.     Heart sounds: Normal heart sounds. No murmur heard. Pulmonary:     Effort: Pulmonary effort is normal. No respiratory distress.     Breath sounds: Normal breath sounds. No wheezing.  Abdominal:     General: Abdomen is flat. Bowel sounds are normal. There is no distension.     Palpations: Abdomen is soft.     Tenderness: There is no abdominal tenderness.  Genitourinary:    Prostate: Normal.     Rectum: Guaiac result negative.  Musculoskeletal:        General: No swelling or tenderness. Normal range of motion.     Cervical back: Normal range of motion and neck supple.  Skin:    General: Skin is warm.     Capillary Refill: Capillary refill takes less than 2 seconds.  Neurological:     General: No focal deficit present.     Mental Status: He is alert and oriented to person, place, and time.     Cranial  Nerves: No cranial nerve deficit.     Motor: No weakness.  Psychiatric:        Mood and Affect: Mood normal.        Behavior: Behavior normal.        Thought Content: Thought content normal.        Judgment: Judgment normal.      Title   Diabetic Foot Exam - detailed Date & Time: 06/14/2023  9:32 AM Diabetic Foot exam was performed with the following findings: Yes  Visual Foot Exam completed.: Yes  Is there a history of foot ulcer?: No Is there a foot ulcer now?: No Is there swelling?: No Is there elevated skin temperature?: No Is there abnormal foot shape?: No Is there a claw toe deformity?: No  Are the toenails long?: No Are the toenails thick?: No Are the toenails ingrown?: No Is the skin thin, fragile, shiny and hairless?": No Normal Range of Motion?: Yes Is there foot or ankle muscle weakness?: No Do you have pain in calf while walking?: No Are the shoes appropriate in style and fit?: Yes Can the patient see the bottom of their feet?: Yes Pulse Foot Exam completed.: Yes   Right Posterior Tibialis: Present Left posterior Tibialis: Present   Right Dorsalis Pedis: Present Left Dorsalis Pedis: Present     Sensory Foot Exam Completed.: Yes Semmes-Weinstein Monofilament Test "+" means "has sensation" and "-" means "no sensation"   R Site 1-Great Toe: Pos L Site 1-Great Toe: Pos   R Site 4: Pos L Site 4: Pos   R Site 6: Pos L Site 6: Pos     Image components are not supported.   Image components are not supported. Image components are not supported.  [place] Comments        Assessment And Plan:    Type 2 diabetes mellitus with both eyes affected by mild nonproliferative retinopathy without macular edema, without long-term current use of insulin (HCC) Assessment & Plan: HgbA1c improved at last visit. Continue Ozempic, tolerating well.  Diabetic foot exam done no abnormal findings.  Orders: -     CMP14+EGFR -     Hemoglobin A1c -     Lipid  panel  Encounter for general adult medical examination w/o abnormal findings Assessment & Plan: Behavior modifications discussed and diet history reviewed.   Pt will continue to exercise regularly and modify diet with low GI, plant based foods and decrease intake of processed foods.  Recommend intake of daily multivitamin, Vitamin D, and calcium.  Recommend colonoscopy for preventive screenings, as well as recommend immunizations that include influenza, TDAP    Encounter for prostate cancer screening -     PSA  BMI 29.0-29.9,adult  Influenza vaccination declined Assessment & Plan: Patient declined influenza vaccination at this time. Patient is aware that influenza vaccine prevents illness in 70% of healthy people, and reduces hospitalizations to 30-70% in elderly. This vaccine is recommended annually. Education has been provided regarding the importance of this vaccine but patient still declined. Advised may receive this vaccine at local pharmacy or Health Dept.or vaccine clinic. Aware to provide a copy of the vaccination record if obtained from local pharmacy or Health Dept.  Pt is willing to accept risk associated with refusing vaccination.    COVID-19 vaccination declined Assessment & Plan: Declines covid 19 vaccine. Discussed risk of covid 4 and if he changes her mind about the vaccine to call the office. Education has been provided regarding the importance of this vaccine but patient still declined. Advised may receive this vaccine at local pharmacy or Health Dept.or vaccine clinic. Aware to provide a copy of the vaccination record if obtained from local pharmacy or Health Dept.  Encouraged to take multivitamin, vitamin d, vitamin c and zinc to increase immune system. Aware can call office if would like to have vaccine here at office. Verbalized acceptance and understanding.    Fertility testing Assessment & Plan: He would like to know his sperm count, will refer to  Urology  Orders: -     Ambulatory referral to Urology  Essential (primary) hypertension Assessment & Plan: EKG done with NSR HR 93, Blood pressure is well controlled, continue current medications.   Orders: -     EKG 12-Lead -  POCT URINALYSIS DIP (CLINITEK) -     Microalbumin / creatinine urine ratio -     amLODIPine Besylate; Take 1 tablet (2.5 mg total) by mouth daily.  Dispense: 90 tablet; Refill: 1 -     Olmesartan Medoxomil; Take 1 tablet (20 mg total) by mouth daily.  Dispense: 90 tablet; Refill: 2  Other long term (current) drug therapy -     CBC with Differential/Platelet    Return for 1 year physical, controlled DM check 4 months. Patient was given opportunity to ask questions. Patient verbalized understanding of the plan and was able to repeat key elements of the plan. All questions were answered to their satisfaction.   [name], FNP  I, [name], FNP, have reviewed all documentation for this visit. The documentation on 06/13/23 for the exam, diagnosis, procedures, and orders are all accurate and complete.

## 2023-06-14 ENCOUNTER — Ambulatory Visit (INDEPENDENT_AMBULATORY_CARE_PROVIDER_SITE_OTHER): Payer: Managed Care, Other (non HMO) | Admitting: Nurse Practitioner

## 2023-06-14 ENCOUNTER — Encounter: Payer: Self-pay | Admitting: Nurse Practitioner

## 2023-06-14 VITALS — BP 126/82 | HR 86 | Temp 98.2°F | Ht 67.0 in | Wt 186.8 lb

## 2023-06-14 DIAGNOSIS — E113293 Type 2 diabetes mellitus with mild nonproliferative diabetic retinopathy without macular edema, bilateral: Secondary | ICD-10-CM

## 2023-06-14 DIAGNOSIS — Z6829 Body mass index (BMI) 29.0-29.9, adult: Secondary | ICD-10-CM | POA: Diagnosis not present

## 2023-06-14 DIAGNOSIS — Z2821 Immunization not carried out because of patient refusal: Secondary | ICD-10-CM | POA: Insufficient documentation

## 2023-06-14 DIAGNOSIS — F5101 Primary insomnia: Secondary | ICD-10-CM

## 2023-06-14 DIAGNOSIS — I10 Essential (primary) hypertension: Secondary | ICD-10-CM

## 2023-06-14 DIAGNOSIS — Z125 Encounter for screening for malignant neoplasm of prostate: Secondary | ICD-10-CM

## 2023-06-14 DIAGNOSIS — Z0000 Encounter for general adult medical examination without abnormal findings: Secondary | ICD-10-CM | POA: Diagnosis not present

## 2023-06-14 DIAGNOSIS — Z3141 Encounter for fertility testing: Secondary | ICD-10-CM

## 2023-06-14 DIAGNOSIS — Z79899 Other long term (current) drug therapy: Secondary | ICD-10-CM

## 2023-06-14 LAB — POCT URINALYSIS DIP (CLINITEK)
Bilirubin, UA: NEGATIVE
Blood, UA: NEGATIVE
Glucose, UA: NEGATIVE mg/dL
Ketones, POC UA: NEGATIVE mg/dL
Leukocytes, UA: NEGATIVE
Nitrite, UA: NEGATIVE
POC PROTEIN,UA: NEGATIVE
Spec Grav, UA: 1.015 (ref 1.010–1.025)
Urobilinogen, UA: 0.2 U/dL
pH, UA: 6.5 (ref 5.0–8.0)

## 2023-06-14 MED ORDER — OLMESARTAN MEDOXOMIL 20 MG PO TABS: 20.0000 mg | ORAL_TABLET | Freq: Every day | ORAL | 2 refills | Status: DC

## 2023-06-14 MED ORDER — AMLODIPINE BESYLATE 2.5 MG PO TABS: 2.5000 mg | ORAL_TABLET | Freq: Every day | ORAL | 1 refills | Status: DC

## 2023-06-14 NOTE — Assessment & Plan Note (Signed)

## 2023-06-14 NOTE — Assessment & Plan Note (Signed)

## 2023-06-14 NOTE — Assessment & Plan Note (Signed)
He would like to know his sperm count, will refer to Urology

## 2023-06-14 NOTE — Assessment & Plan Note (Addendum)
HgbA1c improved at last visit. Continue Ozempic, tolerating well.  Diabetic foot exam done no abnormal findings.

## 2023-06-14 NOTE — Assessment & Plan Note (Signed)
EKG done with NSR HR 93, Blood pressure is well controlled, continue current medications.

## 2023-06-15 LAB — CBC WITH DIFFERENTIAL/PLATELET
Basophils Absolute: 0 10*3/uL (ref 0.0–0.2)
Basos: 1 %
EOS (ABSOLUTE): 0.2 10*3/uL (ref 0.0–0.4)
Eos: 4 %
Hematocrit: 46.3 % (ref 37.5–51.0)
Hemoglobin: 15.3 g/dL (ref 13.0–17.7)
Immature Grans (Abs): 0 10*3/uL (ref 0.0–0.1)
Immature Granulocytes: 0 %
Lymphocytes Absolute: 0.6 10*3/uL — ABNORMAL LOW (ref 0.7–3.1)
Lymphs: 15 %
MCH: 28.9 pg (ref 26.6–33.0)
MCHC: 33 g/dL (ref 31.5–35.7)
MCV: 88 fL (ref 79–97)
Monocytes Absolute: 0.3 10*3/uL (ref 0.1–0.9)
Monocytes: 8 %
Neutrophils Absolute: 2.8 10*3/uL (ref 1.4–7.0)
Neutrophils: 72 %
Platelets: 188 10*3/uL (ref 150–450)
RBC: 5.29 x10E6/uL (ref 4.14–5.80)
RDW: 13.1 % (ref 11.6–15.4)
WBC: 3.9 10*3/uL (ref 3.4–10.8)

## 2023-06-15 LAB — MICROALBUMIN / CREATININE URINE RATIO
Creatinine, Urine: 69.3 mg/dL
Microalb/Creat Ratio: 7 mg/g{creat} (ref 0–29)
Microalbumin, Urine: 4.6 ug/mL

## 2023-06-15 LAB — CMP14+EGFR
ALT: 22 [IU]/L (ref 0–44)
AST: 20 [IU]/L (ref 0–40)
Albumin: 4.5 g/dL (ref 4.1–5.1)
Alkaline Phosphatase: 87 [IU]/L (ref 44–121)
BUN/Creatinine Ratio: 10 (ref 9–20)
BUN: 11 mg/dL (ref 6–24)
Bilirubin Total: 0.4 mg/dL (ref 0.0–1.2)
CO2: 25 mmol/L (ref 20–29)
Calcium: 9.5 mg/dL (ref 8.7–10.2)
Chloride: 103 mmol/L (ref 96–106)
Creatinine, Ser: 1.08 mg/dL (ref 0.76–1.27)
Globulin, Total: 2.6 g/dL (ref 1.5–4.5)
Glucose: 96 mg/dL (ref 70–99)
Potassium: 4.7 mmol/L (ref 3.5–5.2)
Sodium: 144 mmol/L (ref 134–144)
Total Protein: 7.1 g/dL (ref 6.0–8.5)
eGFR: 87 mL/min/{1.73_m2} (ref >59)

## 2023-06-15 LAB — LIPID PANEL
Chol/HDL Ratio: 3.5 ratio (ref 0.0–5.0)
Cholesterol, Total: 153 mg/dL (ref 100–199)
HDL: 44 mg/dL (ref >39)
LDL Chol Calc (NIH): 93 mg/dL (ref 0–99)
Triglycerides: 87 mg/dL (ref 0–149)
VLDL Cholesterol Cal: 16 mg/dL (ref 5–40)

## 2023-06-15 LAB — PSA: Prostate Specific Ag, Serum: 0.8 ng/mL (ref 0.0–4.0)

## 2023-06-15 LAB — HEMOGLOBIN A1C
Est. average glucose Bld gHb Est-mCnc: 120 mg/dL
Hgb A1c MFr Bld: 5.8 % — ABNORMAL HIGH (ref 4.8–5.6)

## 2023-06-24 DIAGNOSIS — Z0000 Encounter for general adult medical examination without abnormal findings: Secondary | ICD-10-CM | POA: Insufficient documentation

## 2023-06-24 NOTE — Assessment & Plan Note (Signed)
Behavior modifications discussed and diet history reviewed.   Pt will continue to exercise regularly and modify diet with low GI, plant based foods and decrease intake of processed foods.  Recommend intake of daily multivitamin, Vitamin D, and calcium.  Recommend colonoscopy for preventive screenings, as well as recommend immunizations that include influenza, TDAP

## 2023-07-17 ENCOUNTER — Encounter: Payer: Self-pay | Admitting: Nurse Practitioner

## 2023-07-19 ENCOUNTER — Other Ambulatory Visit: Payer: Self-pay | Admitting: Nurse Practitioner

## 2023-07-19 DIAGNOSIS — Z3141 Encounter for fertility testing: Secondary | ICD-10-CM

## 2023-08-07 ENCOUNTER — Other Ambulatory Visit: Payer: Self-pay

## 2023-08-07 ENCOUNTER — Encounter: Payer: Self-pay | Admitting: Nurse Practitioner

## 2023-08-07 DIAGNOSIS — Z139 Encounter for screening, unspecified: Secondary | ICD-10-CM

## 2023-08-08 ENCOUNTER — Other Ambulatory Visit: Payer: Self-pay

## 2023-08-25 ENCOUNTER — Encounter: Payer: Self-pay | Admitting: Nurse Practitioner

## 2023-08-28 ENCOUNTER — Other Ambulatory Visit: Payer: Self-pay

## 2023-08-28 DIAGNOSIS — E113293 Type 2 diabetes mellitus with mild nonproliferative diabetic retinopathy without macular edema, bilateral: Secondary | ICD-10-CM

## 2023-08-28 MED ORDER — OZEMPIC (1 MG/DOSE) 4 MG/3ML ~~LOC~~ SOPN: 1.0000 mg | PEN_INJECTOR | SUBCUTANEOUS | 2 refills | Status: DC

## 2023-09-25 ENCOUNTER — Other Ambulatory Visit: Payer: Self-pay

## 2023-09-25 DIAGNOSIS — E113293 Type 2 diabetes mellitus with mild nonproliferative diabetic retinopathy without macular edema, bilateral: Secondary | ICD-10-CM

## 2023-09-25 MED ORDER — OZEMPIC (1 MG/DOSE) 4 MG/3ML ~~LOC~~ SOPN: 1.0000 mg | PEN_INJECTOR | SUBCUTANEOUS | 2 refills | Status: DC

## 2023-10-17 ENCOUNTER — Encounter: Payer: Self-pay | Admitting: Nurse Practitioner

## 2023-10-17 ENCOUNTER — Ambulatory Visit: Payer: Managed Care, Other (non HMO) | Admitting: Nurse Practitioner

## 2023-10-17 VITALS — BP 140/80 | HR 89 | Temp 98.2°F | Ht 67.0 in | Wt 194.4 lb

## 2023-10-17 DIAGNOSIS — Z139 Encounter for screening, unspecified: Secondary | ICD-10-CM

## 2023-10-17 DIAGNOSIS — E113293 Type 2 diabetes mellitus with mild nonproliferative diabetic retinopathy without macular edema, bilateral: Secondary | ICD-10-CM

## 2023-10-17 DIAGNOSIS — E6609 Other obesity due to excess calories: Secondary | ICD-10-CM | POA: Diagnosis not present

## 2023-10-17 DIAGNOSIS — Z2821 Immunization not carried out because of patient refusal: Secondary | ICD-10-CM

## 2023-10-17 DIAGNOSIS — Z6830 Body mass index (BMI) 30.0-30.9, adult: Secondary | ICD-10-CM

## 2023-10-17 DIAGNOSIS — I10 Essential (primary) hypertension: Secondary | ICD-10-CM

## 2023-10-17 DIAGNOSIS — E66811 Obesity, class 1: Secondary | ICD-10-CM | POA: Diagnosis not present

## 2023-10-17 MED ORDER — OZEMPIC (1 MG/DOSE) 4 MG/3ML ~~LOC~~ SOPN: 1.0000 mg | PEN_INJECTOR | SUBCUTANEOUS | 2 refills | Status: DC

## 2023-10-17 NOTE — Progress Notes (Unsigned)
 Madelaine Bhat, CMA,acting as a Neurosurgeon for Tim Felts, FNP.,have documented all relevant documentation on the behalf of Tim Felts, FNP,as directed by  Tim Felts, FNP while in the presence of Tim Felts, FNP.  Subjective:  Patient ID: Tim Tate , male    DOB: 1980/08/21 , 43 y.o.   MRN: [id number]  Chief Complaint  Patient presents with   Hypertension   Diabetes    HPI  Patient presents today for a bp and dm follow up, Patient reports compliance with medication. Patient denies any chest pain, SOB, or headaches. Patient has no concerns today. Patient did not take his medication today.   Diabetes He presents for his follow-up diabetic visit. He has type 2 diabetes mellitus. There are no hypoglycemic associated symptoms. Pertinent negatives for hypoglycemia include no dizziness or headaches. There are no diabetic associated symptoms. Pertinent negatives for diabetes include no chest pain, no fatigue, no polydipsia, no polyphagia and no polyuria. There are no hypoglycemic complications. Diabetic complications include retinopathy. Risk factors for coronary artery disease include sedentary lifestyle and male sex. Current diabetic treatment includes oral agent (triple therapy). He is following a generally healthy diet. When asked about meal planning, he reported none. He has not had a previous visit with a dietitian. He participates in exercise intermittently (2-3 times a week). His home blood glucose trend is decreasing steadily. (Reports blood sugars have been in range with an occasional spike. ) An ACE inhibitor/angiotensin II receptor blocker is being taken. He does not see a podiatrist.Eye exam is current.     Past Medical History:  Diagnosis Date   Diabetes mellitus without complication (HCC)    Type 2   Hypertension    Mild nonproliferative retinopathy due to secondary diabetes (HCC) 02/22/2022   Severe hyperglycemia due to diabetes mellitus (HCC) 06/01/2017   Diabetic  severe hyperglycemia (disorder)       History reviewed. No pertinent family history.   Current Outpatient Medications:    amLODipine (NORVASC) 2.5 MG tablet, Take 1 tablet (2.5 mg total) by mouth daily., Disp: 90 tablet, Rfl: 1   blood glucose meter kit and supplies KIT, Dispense based on patient and insurance preference. Use up to four times daily as directed., Disp: 1 each, Rfl: 0   Insulin Pen Needle 32G X 6 MM MISC, 1 each by Does not apply route once a week., Disp: 100 each, Rfl: 2   olmesartan (BENICAR) 20 MG tablet, Take 1 tablet (20 mg total) by mouth daily., Disp: 90 tablet, Rfl: 2   Semaglutide, 1 MG/DOSE, (OZEMPIC, 1 MG/DOSE,) 4 MG/3ML SOPN, Inject 1 mg into the skin once a week., Disp: 9 mL, Rfl: 2   Allergies  Allergen Reactions   Coconut (Cocos Nucifera) Anaphylaxis   Strawberry Flavoring Agent (Non-Screening) Anaphylaxis     Review of Systems  Constitutional: Negative.  Negative for fatigue.  HENT: Negative.    Eyes: Negative.   Respiratory:  Negative for cough.   Cardiovascular: Negative.  Negative for chest pain.  Gastrointestinal: Negative.   Endocrine: Negative.  Negative for polydipsia, polyphagia and polyuria.  Genitourinary: Negative.   Musculoskeletal: Negative.   Skin: Negative.   Allergic/Immunologic: Negative.   Neurological:  Negative for dizziness and headaches.  Hematological: Negative.   Psychiatric/Behavioral: Negative.       Today's Vitals   10/17/23 0827 10/17/23 0839  BP: (!) 140/80 (!) 140/80  Pulse: 89   Temp: 98.2 F (36.8 C)   TempSrc: Oral   Weight: 194 lb  6.4 oz (88.2 kg)   Height: 5\' 7"  (1.702 m)   PainSc: 0-No pain    Body mass index is 30.45 kg/m.  Wt Readings from Last 3 Encounters:  10/17/23 194 lb 6.4 oz (88.2 kg)  06/14/23 186 lb 12.8 oz (84.7 kg)  02/20/23 185 lb (83.9 kg)      Objective:  Physical Exam Vitals and nursing note reviewed.  Constitutional:      General: He is not in acute distress.     Appearance: Normal appearance.  Cardiovascular:     Rate and Rhythm: Normal rate and regular rhythm.     Pulses: Normal pulses.     Heart sounds: Normal heart sounds. No murmur heard. Pulmonary:     Effort: Pulmonary effort is normal. No respiratory distress.     Breath sounds: Normal breath sounds. No wheezing.  Abdominal:     Palpations: Abdomen is soft.  Skin:    General: Skin is warm.     Capillary Refill: Capillary refill takes less than 2 seconds.  Neurological:     General: No focal deficit present.     Mental Status: He is alert and oriented to person, place, and time.     Cranial Nerves: No cranial nerve deficit.     Motor: No weakness.  Psychiatric:        Mood and Affect: Mood normal.        Behavior: Behavior normal.        Thought Content: Thought content normal.        Judgment: Judgment normal.      Assessment And Plan:  Essential (primary) hypertension Assessment & Plan:  Blood pressure is slightly elevated, he has not taken his blood pressure medications at this time. Repeat is about the same.  Continue current medications.   Orders: -     BMP8+eGFR  Type 2 diabetes mellitus with both eyes affected by mild nonproliferative retinopathy without macular edema, without long-term current use of insulin (HCC) Assessment & Plan: HgbA1c stable.  Continue Ozempic, tolerating well.   Orders: -     Hemoglobin A1c -     Ozempic (1 MG/DOSE); Inject 1 mg into the skin once a week.  Dispense: 9 mL; Refill: 2 -     Lipid panel  COVID-19 vaccination declined Assessment & Plan: Declines covid 19 vaccine. Discussed risk of covid 34 and if he changes her mind about the vaccine to call the office. Education has been provided regarding the importance of this vaccine but patient still declined. Advised may receive this vaccine at local pharmacy or Health Dept.or vaccine clinic. Aware to provide a copy of the vaccination record if obtained from local pharmacy or Health Dept.   Encouraged to take multivitamin, vitamin d, vitamin c and zinc to increase immune system. Aware can call office if would like to have vaccine here at office. Verbalized acceptance and understanding.    Class 1 obesity due to excess calories with body mass index (BMI) of 30.0 to 30.9 in adult, unspecified whether serious comorbidity present Assessment & Plan: He is encouraged to strive for BMI less than 30 to decrease cardiac risk. Advised to aim for at least 150 minutes of exercise per week.    Encounter for screening -     ABO AND RH     Return for controlled DM check 4 months.  Patient was given opportunity to ask questions. Patient verbalized understanding of the plan and was able to repeat key elements of the  plan. All questions were answered to their satisfaction.    Jeanell Sparrow, FNP, have reviewed all documentation for this visit. The documentation on 10/17/23 for the exam, diagnosis, procedures, and orders are all accurate and complete.   IF YOU HAVE BEEN REFERRED TO A SPECIALIST, IT MAY TAKE 1-2 WEEKS TO SCHEDULE/PROCESS THE REFERRAL. IF YOU HAVE NOT HEARD FROM US/SPECIALIST IN TWO WEEKS, PLEASE GIVE [place] A CALL AT [phone].

## 2023-10-17 NOTE — Patient Instructions (Signed)
 Dr. Ashley Royalty Retina Specialist - [phone]

## 2023-10-18 ENCOUNTER — Encounter: Payer: Self-pay | Admitting: Nurse Practitioner

## 2023-10-18 LAB — BMP8+EGFR
BUN/Creatinine Ratio: 9 (ref 9–20)
BUN: 9 mg/dL (ref 6–24)
CO2: 23 mmol/L (ref 20–29)
Calcium: 9.5 mg/dL (ref 8.7–10.2)
Chloride: 107 mmol/L — ABNORMAL HIGH (ref 96–106)
Creatinine, Ser: 1.05 mg/dL (ref 0.76–1.27)
Glucose: 139 mg/dL — ABNORMAL HIGH (ref 70–99)
Potassium: 4.8 mmol/L (ref 3.5–5.2)
Sodium: 147 mmol/L — ABNORMAL HIGH (ref 134–144)
eGFR: 90 mL/min/{1.73_m2} (ref >59)

## 2023-10-18 LAB — HEMOGLOBIN A1C
Est. average glucose Bld gHb Est-mCnc: 117 mg/dL
Hgb A1c MFr Bld: 5.7 % — ABNORMAL HIGH (ref 4.8–5.6)

## 2023-10-18 LAB — LIPID PANEL
Chol/HDL Ratio: 3.5 ratio (ref 0.0–5.0)
Cholesterol, Total: 155 mg/dL (ref 100–199)
HDL: 44 mg/dL (ref >39)
LDL Chol Calc (NIH): 98 mg/dL (ref 0–99)
Triglycerides: 65 mg/dL (ref 0–149)
VLDL Cholesterol Cal: 13 mg/dL (ref 5–40)

## 2023-10-18 LAB — ABO AND RH: Rh Factor: POSITIVE

## 2023-10-19 DIAGNOSIS — E6609 Other obesity due to excess calories: Secondary | ICD-10-CM | POA: Insufficient documentation

## 2023-10-19 NOTE — Assessment & Plan Note (Signed)
 Blood pressure is slightly elevated, he has not taken his blood pressure medications at this time. Repeat is about the same.  Continue current medications.

## 2023-10-19 NOTE — Assessment & Plan Note (Signed)
 HgbA1c stable.  Continue Ozempic, tolerating well.

## 2023-10-19 NOTE — Assessment & Plan Note (Signed)
 He is encouraged to strive for BMI less than 30 to decrease cardiac risk. Advised to aim for at least 150 minutes of exercise per week.

## 2023-10-19 NOTE — Assessment & Plan Note (Signed)

## 2024-03-01 ENCOUNTER — Encounter: Payer: Self-pay | Admitting: Pharmacist

## 2024-03-01 NOTE — Progress Notes (Signed)
   03/01/2024  Patient ID: Lamar KATHEE Bunker, male   DOB: July 19, 1981, 43 y.o.   MRN: [id number] Pharmacy Quality Measure Review  Statin Use in Persons with Diabetes (SUPD)  Clinical ASCVD: No  The 10-year ASCVD risk score (Arnett DK, et al., 2019) is: 13.6%   Values used to calculate the score:     Age: 51 years     Clincally relevant sex: Male     Is Non-Hispanic African American: Yes     Diabetic: Yes     Tobacco smoker: No     Systolic Blood Pressure: 140 mmHg     Is BP treated: Yes     HDL Cholesterol: 44 mg/dL     Total Cholesterol: 155 mg/dL   Upcoming appointment 06/17/24   Lab Results  Component Value Date   HGBA1C 5.7 (H) 10/17/2023   HGBA1C 5.8 (H) 06/14/2023   HGBA1C 5.8 (H) 02/20/2023   Semaglutide  last filled 02/03/24 for a 28 day supply  Delayed note sent to provider prior to the upcoming appointment about statin therapy. Will follow up after his appointment.  Cassius DOROTHA Brought, PharmD, BCACP Clinical Pharmacist [phone]

## 2024-06-17 ENCOUNTER — Encounter: Payer: Self-pay | Admitting: Nurse Practitioner

## 2024-06-17 ENCOUNTER — Ambulatory Visit: Payer: Managed Care, Other (non HMO) | Admitting: Nurse Practitioner

## 2024-06-17 VITALS — BP 140/80 | HR 98 | Temp 98.5°F | Ht 67.0 in | Wt 194.4 lb

## 2024-06-17 DIAGNOSIS — Z6830 Body mass index (BMI) 30.0-30.9, adult: Secondary | ICD-10-CM

## 2024-06-17 DIAGNOSIS — I10 Essential (primary) hypertension: Secondary | ICD-10-CM

## 2024-06-17 DIAGNOSIS — Z79899 Other long term (current) drug therapy: Secondary | ICD-10-CM

## 2024-06-17 DIAGNOSIS — R9431 Abnormal electrocardiogram [ECG] [EKG]: Secondary | ICD-10-CM | POA: Diagnosis not present

## 2024-06-17 DIAGNOSIS — E66811 Obesity, class 1: Secondary | ICD-10-CM | POA: Diagnosis not present

## 2024-06-17 DIAGNOSIS — Z139 Encounter for screening, unspecified: Secondary | ICD-10-CM

## 2024-06-17 DIAGNOSIS — E113293 Type 2 diabetes mellitus with mild nonproliferative diabetic retinopathy without macular edema, bilateral: Secondary | ICD-10-CM | POA: Diagnosis not present

## 2024-06-17 DIAGNOSIS — E6609 Other obesity due to excess calories: Secondary | ICD-10-CM | POA: Diagnosis not present

## 2024-06-17 DIAGNOSIS — Z0000 Encounter for general adult medical examination without abnormal findings: Secondary | ICD-10-CM | POA: Diagnosis not present

## 2024-06-17 DIAGNOSIS — Z2821 Immunization not carried out because of patient refusal: Secondary | ICD-10-CM | POA: Diagnosis not present

## 2024-06-17 DIAGNOSIS — Z125 Encounter for screening for malignant neoplasm of prostate: Secondary | ICD-10-CM

## 2024-06-17 LAB — POCT URINALYSIS DIP (CLINITEK)
Bilirubin, UA: NEGATIVE
Glucose, UA: NEGATIVE mg/dL
Ketones, POC UA: NEGATIVE mg/dL
Leukocytes, UA: NEGATIVE
Nitrite, UA: NEGATIVE
POC PROTEIN,UA: NEGATIVE
Spec Grav, UA: <=1.005 — AB (ref 1.010–1.025)
Urobilinogen, UA: 0.2 U/dL
pH, UA: 6 (ref 5.0–8.0)

## 2024-06-17 MED ORDER — OLMESARTAN MEDOXOMIL 20 MG PO TABS: 20.0000 mg | ORAL_TABLET | Freq: Every day | ORAL | 2 refills | Status: DC

## 2024-06-17 MED ORDER — OZEMPIC (1 MG/DOSE) 4 MG/3ML ~~LOC~~ SOPN: 1.0000 mg | PEN_INJECTOR | SUBCUTANEOUS | 2 refills | Status: AC

## 2024-06-17 NOTE — Assessment & Plan Note (Addendum)
 Hypertension remains elevated. EKG shows right atrial enlargement likely due to hypertension. Referral placed to Cardiology - Instructed to resume amlodipine  in the morning and continue olmesartan  at night. - Rechecked blood pressure before leaving the office with slight improvement - Scheduled a nurse visit in 2-3 weeks to reassess blood pressure control.

## 2024-06-17 NOTE — Assessment & Plan Note (Signed)
 Diabetes management ongoing with semaglutide . Regular blood glucose monitoring reported. - Scheduled referral to an eye specialist for diabetic retinopathy evaluation. - Continue semaglutide  as prescribed.

## 2024-06-17 NOTE — Assessment & Plan Note (Signed)
 Routine visit with concerns noted. Regular exercise reported, dietary improvements needed. - Scheduled referral to an eye specialist for diabetic retinopathy evaluation. - Encouraged regular exercise and dietary improvements.

## 2024-06-17 NOTE — Assessment & Plan Note (Signed)
 Class 1 obesity with BMI between 30.0 to 30.9. Dietary improvements needed, regular exercise reported. - Encouraged dietary improvements and regular exercise.

## 2024-06-17 NOTE — Progress Notes (Signed)
 LILLETTE Kristeen JINNY Gladis, CMA,acting as a neurosurgeon for Gaines Ada, FNP.,have documented all relevant documentation on the behalf of Gaines Ada, FNP,as directed by  Gaines Ada, FNP while in the presence of Gaines Ada, FNP.  Subjective:   Patient ID: Tim Tate , male    DOB: 06-15-1981 , 43 y.o.   MRN: [id number]  Chief Complaint  Patient presents with   Annual Exam    Patient presents today for HM, Patient reports compliance with medication. Patient denies any chest pain, SOB, or headaches. Patient has no concerns today.       Diabetes He presents for his follow-up diabetic visit. He has type 2 diabetes mellitus. There are no hypoglycemic associated symptoms. Pertinent negatives for hypoglycemia include no dizziness or headaches. There are no diabetic associated symptoms. Pertinent negatives for diabetes include no chest pain, no fatigue, no polydipsia, no polyphagia and no polyuria. There are no hypoglycemic complications. Diabetic complications include retinopathy. Risk factors for coronary artery disease include sedentary lifestyle and male sex. Current diabetic treatment includes oral agent (dual therapy). Diabetic current diet: Regular diet. He has not had a previous visit with a dietitian. He rarely participates in exercise. (Blood sugar has been 112-114)    Discussed the use of AI scribe software for clinical note transcription with the patient, who gave verbal consent to proceed.  History of Present Illness Tim Tate is a 43 year old male who presents for an annual physical exam.  He exercises at least twice a week but describes his diet as inconsistent, often consuming processed foods due to a busy schedule. He does not eat much over the weekend and consumes salty foods.  Regarding his hypertension, he is currently taking olmesartan  and Ozempic  but not amlodipine . He recalls being told he didn't need to take both medications and has a large supply of olmesartan . He does not  regularly check his blood pressure at home and notes that his blood pressure was elevated today, possibly due to dietary choices over the weekend.  His blood sugar levels have been 'alright' and he is still checking them regularly. He has not visited an retina specialist recently, although he was referred to a specialist on [address] in the past. He has not experienced any eye problems.  No issues with constipation, diarrhea, urinary problems, or nocturia. No chest pain, shortness of breath, or swelling in his feet or ankles. He has no trouble swallowing, although he experiences some difficulty when chewing food.  Past Medical History:  Diagnosis Date   Diabetes mellitus without complication (HCC)    Type 2   Hypertension    Mild nonproliferative retinopathy due to secondary diabetes (HCC) 02/22/2022   Severe hyperglycemia due to diabetes mellitus (HCC) 06/01/2017   Diabetic severe hyperglycemia (disorder)       History reviewed. No pertinent family history.   Current Outpatient Medications:    blood glucose meter kit and supplies KIT, Dispense based on patient and insurance preference. Use up to four times daily as directed., Disp: 1 each, Rfl: 0   Insulin  Pen Needle 32G X 6 MM MISC, 1 each by Does not apply route once a week., Disp: 100 each, Rfl: 2   amLODipine  (NORVASC ) 2.5 MG tablet, Take 1 tablet (2.5 mg total) by mouth daily. (Patient not taking: Reported on 06/17/2024), Disp: 90 tablet, Rfl: 1   olmesartan  (BENICAR ) 20 MG tablet, Take 1 tablet (20 mg total) by mouth daily., Disp: 90 tablet, Rfl: 2   Semaglutide , 1  MG/DOSE, (OZEMPIC , 1 MG/DOSE,) 4 MG/3ML SOPN, Inject 1 mg into the skin once a week., Disp: 9 mL, Rfl: 2   Allergies  Allergen Reactions   Coconut (Cocos Nucifera) Anaphylaxis   Strawberry Flavoring Agent (Non-Screening) Anaphylaxis     Men's preventive visit. Patient Health Questionnaire (PHQ-2) is  Flowsheet Row Office Visit from 06/17/2024 in [facility]  [facility]  PHQ-2 Total Score 0   Patient is on a Regular diet; admits it could be better. Exercise - 2-3 days a week for about one hour. Marital status: Single. Relevant history for alcohol use is:  Social History   Substance and Sexual Activity  Alcohol Use No  . Relevant history for tobacco use is:  Social History   Tobacco Use  Smoking Status Never  Smokeless Tobacco Never  .   Review of Systems  Constitutional: Negative.  Negative for fatigue.  HENT: Negative.    Eyes: Negative.   Respiratory: Negative.  Negative for cough.   Cardiovascular: Negative.  Negative for chest pain.  Gastrointestinal: Negative.   Endocrine: Negative.  Negative for polydipsia, polyphagia and polyuria.  Genitourinary: Negative.   Musculoskeletal: Negative.   Skin: Negative.   Allergic/Immunologic: Negative.   Neurological: Negative.  Negative for dizziness and headaches.  Hematological: Negative.   Psychiatric/Behavioral: Negative.       Today's Vitals   06/17/24 0850 06/17/24 0928  BP: (!) 140/90 (!) 140/80  Pulse: 98   Temp: 98.5 F (36.9 C)   TempSrc: Oral   Weight: 194 lb 6.4 oz (88.2 kg)   Height: 5' 7 (1.702 m)   PainSc: 0-No pain    Body mass index is 30.45 kg/m.  Wt Readings from Last 3 Encounters:  06/17/24 194 lb 6.4 oz (88.2 kg)  10/17/23 194 lb 6.4 oz (88.2 kg)  06/14/23 186 lb 12.8 oz (84.7 kg)    Objective:  Physical Exam Vitals and nursing note reviewed.  Constitutional:      General: He is not in acute distress.    Appearance: Normal appearance.  HENT:     Head: Normocephalic and atraumatic.     Right Ear: Tympanic membrane, ear canal and external ear normal. There is no impacted cerumen.     Left Ear: Tympanic membrane, ear canal and external ear normal. There is no impacted cerumen.     Nose: Nose normal.     Mouth/Throat:     Mouth: Mucous membranes are moist.  Eyes:     Extraocular Movements: Extraocular movements intact.      Conjunctiva/sclera: Conjunctivae normal.     Pupils: Pupils are equal, round, and reactive to light.  Cardiovascular:     Rate and Rhythm: Normal rate and regular rhythm.     Pulses: Normal pulses.     Heart sounds: Normal heart sounds. No murmur heard. Pulmonary:     Effort: Pulmonary effort is normal. No respiratory distress.     Breath sounds: Normal breath sounds. No wheezing.  Abdominal:     General: Abdomen is flat. Bowel sounds are normal. There is no distension.     Palpations: Abdomen is soft.     Tenderness: There is no abdominal tenderness.  Genitourinary:    Comments: Deferred Musculoskeletal:        General: No swelling or tenderness. Normal range of motion.     Cervical back: Normal range of motion and neck supple.  Skin:    General: Skin is warm.     Capillary Refill: Capillary refill  takes less than 2 seconds.  Neurological:     General: No focal deficit present.     Mental Status: He is alert and oriented to person, place, and time.     Cranial Nerves: No cranial nerve deficit.     Motor: No weakness.  Psychiatric:        Mood and Affect: Mood normal.        Behavior: Behavior normal.        Thought Content: Thought content normal.        Judgment: Judgment normal.      Assessment And Plan:    Encounter for annual health examination Assessment & Plan: Routine visit with concerns noted. Regular exercise reported, dietary improvements needed. - Scheduled referral to an eye specialist for diabetic retinopathy evaluation. - Encouraged regular exercise and dietary improvements.   Essential (primary) hypertension Assessment & Plan: Hypertension remains elevated. EKG shows right atrial enlargement likely due to hypertension. Referral placed to Cardiology - Instructed to resume amlodipine  in the morning and continue olmesartan  at night. - Rechecked blood pressure before leaving the office with slight improvement - Scheduled a nurse visit in 2-3 weeks to  reassess blood pressure control.  Orders: -     EKG 12-Lead -     POCT URINALYSIS DIP (CLINITEK) -     Microalbumin / creatinine urine ratio -     CMP14+EGFR -     Olmesartan  Medoxomil; Take 1 tablet (20 mg total) by mouth daily.  Dispense: 90 tablet; Refill: 2 -     Ambulatory referral to Cardiology  Type 2 diabetes mellitus with both eyes affected by mild nonproliferative retinopathy without macular edema, without long-term current use of insulin  [facility]) Assessment & Plan: Diabetes management ongoing with semaglutide . Regular blood glucose monitoring reported. - Scheduled referral to an eye specialist for diabetic retinopathy evaluation. - Continue semaglutide  as prescribed.  Orders: -     EKG 12-Lead -     POCT URINALYSIS DIP (CLINITEK) -     Microalbumin / creatinine urine ratio -     CMP14+EGFR -     Hemoglobin A1c -     Lipid panel -     Ambulatory referral to Ophthalmology -     Ozempic  (1 MG/DOSE); Inject 1 mg into the skin once a week.  Dispense: 9 mL; Refill: 2 -     Ambulatory referral to Cardiology  Influenza vaccination declined  Class 1 obesity due to excess calories with body mass index (BMI) of 30.0 to 30.9 in adult, unspecified whether serious comorbidity present Assessment & Plan: Class 1 obesity with BMI between 30.0 to 30.9. Dietary improvements needed, regular exercise reported. - Encouraged dietary improvements and regular exercise.   Other long term (current) drug therapy -     CBC with Differential/Platelet  Encounter for screening -     Hepatitis B surface antibody,qualitative  Encounter for prostate cancer screening -     PSA  Abnormal EKG -     Ambulatory referral to Cardiology   Return for 1 year physical, controlled DM check 4 months. Patient was given opportunity to ask questions. Patient verbalized understanding of the plan and was able to repeat key elements of the plan. All questions were answered to their satisfaction.   Gaines Ada,  FNP  I, Gaines Ada, FNP, have reviewed all documentation for this visit. The documentation on 06/17/24 for the exam, diagnosis, procedures, and orders are all accurate and complete.

## 2024-06-18 ENCOUNTER — Ambulatory Visit: Payer: Self-pay | Admitting: Nurse Practitioner

## 2024-06-18 LAB — CMP14+EGFR
ALT: 18 IU/L (ref 0–44)
AST: 20 IU/L (ref 0–40)
Albumin: 4.1 g/dL (ref 4.1–5.1)
Alkaline Phosphatase: 81 IU/L (ref 47–123)
BUN/Creatinine Ratio: 8 — ABNORMAL LOW (ref 9–20)
BUN: 8 mg/dL (ref 6–24)
Bilirubin Total: 0.5 mg/dL (ref 0.0–1.2)
CO2: 23 mmol/L (ref 20–29)
Calcium: 9.3 mg/dL (ref 8.7–10.2)
Chloride: 103 mmol/L (ref 96–106)
Creatinine, Ser: 1.05 mg/dL (ref 0.76–1.27)
Globulin, Total: 2.8 g/dL (ref 1.5–4.5)
Glucose: 91 mg/dL (ref 70–99)
Potassium: 4.5 mmol/L (ref 3.5–5.2)
Sodium: 141 mmol/L (ref 134–144)
Total Protein: 6.9 g/dL (ref 6.0–8.5)
eGFR: 90 mL/min/1.73 (ref >59)

## 2024-06-18 LAB — CBC WITH DIFFERENTIAL/PLATELET
Basophils Absolute: 0 x10E3/uL (ref 0.0–0.2)
Basos: 0 %
EOS (ABSOLUTE): 0.2 x10E3/uL (ref 0.0–0.4)
Eos: 6 %
Hematocrit: 45.3 % (ref 37.5–51.0)
Hemoglobin: 15 g/dL (ref 13.0–17.7)
Immature Grans (Abs): 0 x10E3/uL (ref 0.0–0.1)
Immature Granulocytes: 0 %
Lymphocytes Absolute: 0.8 x10E3/uL (ref 0.7–3.1)
Lymphs: 22 %
MCH: 29.6 pg (ref 26.6–33.0)
MCHC: 33.1 g/dL (ref 31.5–35.7)
MCV: 89 fL (ref 79–97)
Monocytes Absolute: 0.4 x10E3/uL (ref 0.1–0.9)
Monocytes: 11 %
Neutrophils Absolute: 2.3 x10E3/uL (ref 1.4–7.0)
Neutrophils: 61 %
Platelets: 197 x10E3/uL (ref 150–450)
RBC: 5.07 x10E6/uL (ref 4.14–5.80)
RDW: 13.2 % (ref 11.6–15.4)
WBC: 3.7 x10E3/uL (ref 3.4–10.8)

## 2024-06-18 LAB — LIPID PANEL
Chol/HDL Ratio: 3.5 ratio (ref 0.0–5.0)
Cholesterol, Total: 145 mg/dL (ref 100–199)
HDL: 41 mg/dL (ref >39)
LDL Chol Calc (NIH): 93 mg/dL (ref 0–99)
Triglycerides: 48 mg/dL (ref 0–149)
VLDL Cholesterol Cal: 11 mg/dL (ref 5–40)

## 2024-06-18 LAB — MICROALBUMIN / CREATININE URINE RATIO
Creatinine, Urine: 80.7 mg/dL
Microalb/Creat Ratio: 6 mg/g{creat} (ref 0–29)
Microalbumin, Urine: 4.5 ug/mL

## 2024-06-18 LAB — HEPATITIS B SURFACE ANTIBODY,QUALITATIVE: Hep B Surface Ab, Qual: NONREACTIVE

## 2024-06-18 LAB — PSA: Prostate Specific Ag, Serum: 0.9 ng/mL (ref 0.0–4.0)

## 2024-06-18 LAB — HEMOGLOBIN A1C
Est. average glucose Bld gHb Est-mCnc: 117 mg/dL
Hgb A1c MFr Bld: 5.7 % — ABNORMAL HIGH (ref 4.8–5.6)

## 2024-06-25 ENCOUNTER — Encounter: Payer: Self-pay | Admitting: Nurse Practitioner

## 2024-06-25 ENCOUNTER — Other Ambulatory Visit: Payer: Self-pay

## 2024-06-25 DIAGNOSIS — I10 Essential (primary) hypertension: Secondary | ICD-10-CM

## 2024-06-25 MED ORDER — AMLODIPINE BESYLATE 2.5 MG PO TABS: 2.5000 mg | ORAL_TABLET | Freq: Every day | ORAL | 1 refills | Status: AC

## 2024-07-09 ENCOUNTER — Ambulatory Visit

## 2024-07-09 VITALS — BP 130/80 | HR 91 | Temp 98.5°F | Ht 67.0 in | Wt 194.0 lb

## 2024-07-09 DIAGNOSIS — I10 Essential (primary) hypertension: Secondary | ICD-10-CM

## 2024-07-09 MED ORDER — AMLODIPINE BESYLATE 5 MG PO TABS: 5.0000 mg | ORAL_TABLET | Freq: Every day | ORAL | 11 refills | Status: AC

## 2024-07-09 NOTE — Progress Notes (Signed)
 Patient is in office today for a nurse visit for Blood Pressure Check. Patient currently taking amLODipine  2.5mg  AM, olmesartan  20mg  PM. Patient blood pressure was 140/80, Patient No chest pain, No shortness of breath, No dyspnea on exertion, No orthopnea, No paroxysmal nocturnal dyspnea, No edema, No palpitations, No syncope BP Readings from Last 3 Encounters:  07/09/24 (!) 140/80  06/17/24 (!) 140/80  10/17/23 (!) 140/80   PER PROVIDER- increase amLODipine  5mg  daily f/u next visit.

## 2024-07-19 ENCOUNTER — Other Ambulatory Visit: Payer: Self-pay | Admitting: Nurse Practitioner

## 2024-08-14 ENCOUNTER — Encounter: Payer: Self-pay | Admitting: Student in an Organized Health Care Education/Training Program

## 2024-08-14 ENCOUNTER — Other Ambulatory Visit (HOSPITAL_COMMUNITY): Payer: Self-pay

## 2024-08-14 ENCOUNTER — Ambulatory Visit: Attending: Cardiology | Admitting: Student in an Organized Health Care Education/Training Program

## 2024-08-14 VITALS — BP 140/62 | HR 86 | Ht 69.0 in | Wt 200.0 lb

## 2024-08-14 DIAGNOSIS — R0683 Snoring: Secondary | ICD-10-CM

## 2024-08-14 DIAGNOSIS — I10 Essential (primary) hypertension: Secondary | ICD-10-CM

## 2024-08-14 MED ORDER — OLMESARTAN MEDOXOMIL 40 MG PO TABS
40.0000 mg | ORAL_TABLET | Freq: Every day | ORAL | 3 refills | Status: AC
  Filled 2024-08-14: qty 90, 90d supply, fill #0

## 2024-08-14 MED ORDER — OMRON 3 SERIES BP MONITOR DEVI
0 refills | Status: AC
  Filled 2024-08-14: qty 1, 30d supply, fill #0

## 2024-08-14 MED ORDER — ROSUVASTATIN CALCIUM 5 MG PO TABS
5.0000 mg | ORAL_TABLET | Freq: Every day | ORAL | 3 refills | Status: AC
  Filled 2024-08-14: qty 90, 90d supply, fill #0

## 2024-08-14 NOTE — Progress Notes (Signed)
 " Cardiology Office Note:  .   Date:  08/14/2024  ID:  Tim Tate, DOB 1981-01-01, MRN [id number] PCP: Georgina Speaks, FNP  [facility] Providers Cardiologist:  Georganna Archer, MD { Chief Complaint:  Chief Complaint  Patient presents with   Hypertension    History of Present Illness: .    Tim Tate is a 44 y.o. male with a PMH of HTN and DM2 who presents as a new patient referral by Speaks Georgina for evaluation of HTN.      The patient established care with me on 08/14/2024 for hypertension management.      Discussed the use of AI scribe software for clinical note transcription with the patient, who gave verbal consent to proceed.  History of Present Illness Tim Tate is a 44 year old male with hypertension who presents with elevated blood pressure and concerns about heart health.  He has been aware of elevated blood pressure since the summer of 2025, with initial readings around 171/97 mmHg. He is currently taking amlodipine  5 mg daily and olmesartan  20 mg daily, adhering to the prescribed regimen without experiencing side effects. There is a family history of hypertension in his mother and maternal grandparents, but he is unaware of his paternal side's medical history.  He reports being told that an EKG showed swelling in the lower left ventricle, though he is unsure of the implications. He seeks further evaluation to determine if this is related to his blood pressure or another issue. No chest pain, shortness of breath, or swelling is reported.  He recalls a past incomplete sleep study due to work commitments, where he was told he might stop breathing at night, but this was not recent. He acknowledges occasional snoring but no recent sleep apnea symptoms.  He has type 2 diabetes and is on Ozempic , one unit once a week. He does not smoke, use illicit drugs, or consume alcohol. 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   Studies Reviewed: SABRA    EKG: I independently reviewed the patient's EKG and it shows NSR without LVH or atrial abnormality           Results Labs Lipid panel: LDL 93; within normal limits  Diagnostic EKG (06/2024): No evidence of left ventricular hypertrophy; no left ventricular wall thickening; borderline right atrial enlargement not meeting criteria; no structural cardiac abnormality (Independently interpreted)  Risk Assessment/Calculations:           Physical Exam:    VS:  BP (!) 140/62   Pulse 86   Ht 5' 9 (1.753 m)   Wt 200 lb (90.7 kg)   SpO2 98%   BMI 29.53 kg/m      Wt Readings from Last 3 Encounters:  07/09/24 194 lb (88 kg)  06/17/24 194 lb 6.4 oz (88.2 kg)  10/17/23 194 lb 6.4 oz (88.2 kg)     GEN: Well nourished, well developed, in no acute distress NECK: No JVD; No carotid bruits CARDIAC: RRR, no murmurs, rubs, gallops RESPIRATORY:  Clear to auscultation without rales, wheezing or rhonchi  ABDOMEN: Soft, non-tender, non-distended, normal bowel sounds EXTREMITIES:  Warm and well perfused, no edema; No deformity, 2+ radial pulses PSYCH: Normal mood and affect   ASSESSMENT AND PLAN: .     Assessment & Plan  #HTN - Patient with recently diagnosed hypertension that may have been longer standing than he  realizes. - He has a strong family history of hypertension which may ultimately be the driver of his current hypertension; however, I think ruling out secondary causes such as sleep apnea and Conn syndrome are important particularly since he endorses snoring and apnea. - His EKG looks fine so not worried about any structural heart disease at this time. - His blood pressure remains elevated above his goal of less than 130/80 so we will increase his olmesartan  today. Increase olmesartan  to 40 mg daily Continue amlodipine  5 mg daily Sleep study Check renin/aldosterone  level Prescribed blood pressure cuff Patient will provide a 2-week blood pressure log Follow-up in 2 months  #HLD #DM2 - Patient has type 2 diabetes and needs to have an LDL less than 70. - Last LDL was 92. - I will start the patient on low-dose Crestor  to achieve LDL improvement. Start Crestor  5 mg daily Will check hepatic function panel and lipid panel at follow-up         This note was written with the assistance of a dictation microphone or AI dictation software. Please excuse any typos or grammatical errors.   Signed, Georganna Archer, MD  08/14/2024 1:23 PM    [facility] "

## 2024-08-14 NOTE — Patient Instructions (Signed)
 Medication Instructions:  INCREASE Olmesartan  to 40 mg daily   START Crestor  5 mg daily   *If you need a refill on your cardiac medications before your next appointment, please call your pharmacy*  Lab Work: Renin-Aldosterone   If you have labs (blood work) drawn today and your tests are completely normal, you will receive your results only by: MyChart Message (if you have MyChart) OR A paper copy in the mail If you have any lab test that is abnormal or we need to change your treatment, we will call you to review the results.  Testing/Procedures: Verneda  Your physician has recommended that you have a sleep study. This test records several body functions during sleep, including: brain activity, eye movement, oxygen and carbon dioxide blood levels, heart rate and rhythm, breathing rate and rhythm, the flow of air through your mouth and nose, snoring, body muscle movements, and chest and belly movement.  CHECK BLOOD PRESSURE TWICE DAILY FOR TWO WEEKS AND CONTACT OFFICE TO ADVISE RESULTS:  Blood Pressure Record Sheet To take your blood pressure, you will need a blood pressure machine. You can buy a blood pressure machine (blood pressure monitor) at your clinic, drug store, or online. When choosing one, consider: An automatic monitor that has an arm cuff. A cuff that wraps snugly around your upper arm. You should be able to fit only one finger between your arm and the cuff. A device that stores blood pressure reading results. Do not choose a monitor that measures your blood pressure from your wrist or finger. Follow your health care provider's instructions for how to take your blood pressure. To use this form: Take your blood pressure medications every day These measurements should be taken when you have been at rest for at least 10-15 min Take at least 2 readings with each blood pressure check. This makes sure the results are correct. Wait 1-2 minutes between measurements. Write down the  results in the spaces on this form. Keep in mind it should always be recorded systolic over diastolic. Both numbers are important.  Repeat this every day for 2-3 weeks, or as told by your health care provider.  Make a follow-up appointment with your health care provider to discuss the results.  Blood Pressure Log Date Medications taken? (Y/N) Blood Pressure Time of Day                                                                                                         Follow-Up: At [facility], you and your health needs are our priority.  As part of our continuing mission to provide you with exceptional heart care, our providers are all part of one team.  This team includes your primary Cardiologist (physician) and Advanced Practice Providers or APPs (Physician Assistants and Nurse Practitioners) who all work together to provide you with the care you need, when you need it.  Your next appointment:   2 month(s)  Provider:   Georganna Archer, MD    We recommend signing up for the patient portal called MyChart.  Sign up information is provided on this After Visit Summary.  MyChart is used to connect with patients for Virtual Visits (Telemedicine).  Patients are able to view lab/test results, encounter notes, upcoming appointments, etc.  Non-urgent messages can be sent to your provider as well.   To learn more about what you can do with MyChart, go to [email or web address].

## 2024-08-21 ENCOUNTER — Ambulatory Visit: Payer: Self-pay | Admitting: Student in an Organized Health Care Education/Training Program

## 2024-08-21 LAB — ALDOSTERONE + RENIN ACTIVITY W/ RATIO
Aldosterone: <1 ng/dL (ref 0.0–30.0)
Renin Activity, Plasma: 4.536 ng/mL/h (ref 0.167–5.380)

## 2024-10-14 ENCOUNTER — Ambulatory Visit: Admitting: Student in an Organized Health Care Education/Training Program

## 2024-10-15 ENCOUNTER — Ambulatory Visit: Admitting: Nurse Practitioner

## 2025-06-18 ENCOUNTER — Encounter: Admitting: Nurse Practitioner
